# Patient Record
Sex: Female | Born: 1980 | Race: White | Hispanic: No | Marital: Married | Smoking: Never smoker
Health system: Southern US, Community
[De-identification: ages and names within clinical notes are randomized; demographics above are authoritative.]

## PROBLEM LIST (undated history)

## (undated) DIAGNOSIS — Z803 Family history of malignant neoplasm of breast: Secondary | ICD-10-CM

## (undated) DIAGNOSIS — Z8619 Personal history of other infectious and parasitic diseases: Secondary | ICD-10-CM

## (undated) DIAGNOSIS — Z8051 Family history of malignant neoplasm of kidney: Secondary | ICD-10-CM

## (undated) DIAGNOSIS — K219 Gastro-esophageal reflux disease without esophagitis: Secondary | ICD-10-CM

## (undated) HISTORY — DX: Family history of malignant neoplasm of kidney: Z80.51

## (undated) HISTORY — PX: REFRACTIVE SURGERY: SHX103

## (undated) HISTORY — DX: Family history of malignant neoplasm of breast: Z80.3

## (undated) HISTORY — DX: Personal history of other infectious and parasitic diseases: Z86.19

## (undated) HISTORY — PX: WISDOM TOOTH EXTRACTION: SHX21

---

## 2009-10-16 ENCOUNTER — Ambulatory Visit (HOSPITAL_COMMUNITY): Admission: RE | Admit: 2009-10-16 | Discharge: 2009-10-16 | Payer: Self-pay | Admitting: Obstetrics and Gynecology

## 2012-02-19 LAB — OB RESULTS CONSOLE HIV ANTIBODY (ROUTINE TESTING): HIV: NONREACTIVE

## 2012-02-19 LAB — OB RESULTS CONSOLE ABO/RH: RH Type: POSITIVE

## 2012-02-19 LAB — OB RESULTS CONSOLE GC/CHLAMYDIA: Chlamydia: NEGATIVE

## 2012-10-01 ENCOUNTER — Inpatient Hospital Stay (HOSPITAL_COMMUNITY)
Admission: AD | Admit: 2012-10-01 | Payer: BC Managed Care – PPO | Source: Ambulatory Visit | Admitting: Obstetrics and Gynecology

## 2012-10-01 ENCOUNTER — Telehealth (HOSPITAL_COMMUNITY): Payer: Self-pay | Admitting: *Deleted

## 2012-10-01 ENCOUNTER — Encounter (HOSPITAL_COMMUNITY): Payer: Self-pay | Admitting: *Deleted

## 2012-10-01 NOTE — Telephone Encounter (Signed)
Preadmission screen  

## 2012-10-04 ENCOUNTER — Inpatient Hospital Stay (HOSPITAL_COMMUNITY)
Admission: RE | Admit: 2012-10-04 | Discharge: 2012-10-09 | DRG: 370 | Disposition: A | Discharge status: Home / Self Care | Payer: BC Managed Care – PPO | Source: Ambulatory Visit | Attending: Obstetrics & Gynecology | Admitting: Obstetrics & Gynecology

## 2012-10-04 ENCOUNTER — Encounter (HOSPITAL_COMMUNITY): Payer: Self-pay

## 2012-10-04 DIAGNOSIS — D62 Acute posthemorrhagic anemia: Secondary | ICD-10-CM | POA: Diagnosis not present

## 2012-10-04 DIAGNOSIS — O330 Maternal care for disproportion due to deformity of maternal pelvic bones: Principal | ICD-10-CM | POA: Diagnosis present

## 2012-10-04 DIAGNOSIS — O9903 Anemia complicating the puerperium: Secondary | ICD-10-CM | POA: Diagnosis not present

## 2012-10-04 DIAGNOSIS — O339 Maternal care for disproportion, unspecified: Secondary | ICD-10-CM | POA: Diagnosis present

## 2012-10-04 LAB — CBC
MCH: 33.3 pg (ref 26.0–34.0)
MCHC: 35.1 g/dL (ref 30.0–36.0)
Platelets: 242 10*3/uL (ref 150–400)

## 2012-10-04 MED ORDER — FLEET ENEMA 7-19 GM/118ML RE ENEM: 1.0000 | ENEMA | RECTAL | Status: DC | PRN

## 2012-10-04 MED ORDER — ACETAMINOPHEN 325 MG PO TABS: 650.0000 mg | ORAL_TABLET | ORAL | Status: DC | PRN

## 2012-10-04 MED ORDER — LIDOCAINE HCL (PF) 1 % IJ SOLN: 30.0000 mL | INTRAMUSCULAR | Status: DC | PRN

## 2012-10-04 MED ORDER — IBUPROFEN 600 MG PO TABS: 600.0000 mg | ORAL_TABLET | Freq: Four times a day (QID) | ORAL | Status: DC | PRN

## 2012-10-04 MED ORDER — OXYCODONE-ACETAMINOPHEN 5-325 MG PO TABS: 1.0000 | ORAL_TABLET | ORAL | Status: DC | PRN

## 2012-10-04 MED ORDER — CITRIC ACID-SODIUM CITRATE 334-500 MG/5ML PO SOLN
30.0000 mL | ORAL | Status: DC | PRN
  Administered 2012-10-07 (×2): 30 mL via ORAL
  Filled 2012-10-04 (×2): qty 15

## 2012-10-04 MED ORDER — OXYTOCIN 40 UNITS IN LACTATED RINGERS INFUSION - SIMPLE MED
62.5000 mL/h | INTRAVENOUS | Status: DC
  Filled 2012-10-04 (×2): qty 1000

## 2012-10-04 MED ORDER — LACTATED RINGERS IV SOLN
INTRAVENOUS | Status: DC
  Administered 2012-10-04 – 2012-10-07 (×5): via INTRAVENOUS

## 2012-10-04 MED ORDER — ONDANSETRON HCL 4 MG/2ML IJ SOLN
4.0000 mg | Freq: Four times a day (QID) | INTRAMUSCULAR | Status: DC | PRN
  Administered 2012-10-06: 4 mg via INTRAVENOUS
  Filled 2012-10-04: qty 2

## 2012-10-04 MED ORDER — LACTATED RINGERS IV SOLN
500.0000 mL | INTRAVENOUS | Status: DC | PRN
  Administered 2012-10-06: 500 mL via INTRAVENOUS

## 2012-10-04 MED ORDER — TERBUTALINE SULFATE 1 MG/ML IJ SOLN: 0.2500 mg | Freq: Once | INTRAMUSCULAR | Status: AC | PRN

## 2012-10-04 MED ORDER — MISOPROSTOL 25 MCG QUARTER TABLET
25.0000 ug | ORAL_TABLET | ORAL | Status: DC | PRN
  Administered 2012-10-04 – 2012-10-05 (×2): 25 ug via VAGINAL
  Filled 2012-10-04 (×4): qty 0.25

## 2012-10-04 MED ORDER — OXYTOCIN BOLUS FROM INFUSION: 500.0000 mL | INTRAVENOUS | Status: DC

## 2012-10-05 LAB — RPR: RPR Ser Ql: NONREACTIVE

## 2012-10-05 MED ORDER — MISOPROSTOL 25 MCG QUARTER TABLET
25.0000 ug | ORAL_TABLET | ORAL | Status: DC
  Administered 2012-10-05 – 2012-10-06 (×3): 25 ug via VAGINAL
  Filled 2012-10-05: qty 0.25
  Filled 2012-10-05 (×2): qty 1

## 2012-10-05 MED ORDER — TERBUTALINE SULFATE 1 MG/ML IJ SOLN: 0.2500 mg | Freq: Once | INTRAMUSCULAR | Status: AC | PRN

## 2012-10-05 MED ORDER — ZOLPIDEM TARTRATE 5 MG PO TABS
5.0000 mg | ORAL_TABLET | Freq: Every evening | ORAL | Status: DC | PRN
  Administered 2012-10-05: 5 mg via ORAL
  Filled 2012-10-05: qty 1

## 2012-10-05 MED ORDER — OXYTOCIN 40 UNITS IN LACTATED RINGERS INFUSION - SIMPLE MED
1.0000 m[IU]/min | INTRAVENOUS | Status: DC
  Administered 2012-10-05: 1 m[IU]/min via INTRAVENOUS

## 2012-10-05 NOTE — Progress Notes (Signed)
FHT reactive UCs q2-3 min Pitocin on 18 mU Cx no change per nurse check  Will stop pitocin and repeat cytotec this pm

## 2012-10-05 NOTE — Progress Notes (Signed)
FHT reactive UCs q2-4 min, mod Pitocin on

## 2012-10-05 NOTE — H&P (Signed)
Sabrina Reed is a 31 y.o. female presenting for IOL.  S/P cytotec x 2 last pm.  No HA, vision change or epigastric pain. No ROM.                                                                                                                                                                                                                                    Maternal Medical History:  Fetal activity: Perceived fetal activity is normal.      OB History    Grav Para Term Preterm Abortions TAB SAB Ect Mult Living   1 0 0 0 0 0 0 0 0 0      Past Medical History  Diagnosis Date  . Newborn product of IVF pregnancy   . H/O varicella    Past Surgical History  Procedure Date  . Wisdom tooth extraction    Family History: family history includes Autoimmune disease in her cousin, maternal aunt, and mother; Cancer in her father, maternal grandmother, and mother; Mental retardation in her cousin; and Thyroid disease in her mother. Social History:  reports that she has never smoked. She has never used smokeless tobacco. She reports that she does not drink alcohol or use illicit drugs.   Prenatal Transfer Tool  Maternal Diabetes: No Genetic Screening: Normal Maternal Ultrasounds/Referrals: Normal Fetal Ultrasounds or other Referrals:  None Maternal Substance Abuse:  No Significant Maternal Medications:  None Significant Maternal Lab Results:  None Other Comments:  None  Review of Systems  Eyes: Negative for blurred vision.  Gastrointestinal: Negative for abdominal pain.  Neurological: Negative for headaches.    Dilation: 1 Effacement (%): 40 Station: -2 Exam by:: N. Deal, RN Blood pressure 116/70, pulse 62, temperature 98.4 F (36.9 C), temperature source Oral, resp. rate 16, height 5' 2.5" (1.588 m), weight 170 lb (77.111 kg). Maternal Exam:  Uterine Assessment: Contraction strength is mild.  Contraction frequency is irregular.   Abdomen: Fetal presentation: vertex     Fetal  Exam Fetal Monitor Review: Pattern: accelerations present.       Physical Exam  Cardiovascular: Normal rate and regular rhythm.   Respiratory: Effort normal and breath sounds normal.  GI: There is no tenderness.  Neurological: She has normal reflexes.    Prenatal labs: ABO, Rh: A/Positive/-- (05/15 0000) Antibody: Negative (05/15 0000) Rubella: Immune (05/15 0000) RPR: NON REACTIVE (12/29 2000)  HBsAg: Negative (05/15 0000)  HIV:  Non-reactive (05/15 0000)  GBS: Negative (11/22 0000)   Assessment/Plan: 31 yo G1P0 at 40 4/7 weeks for IOL. D/W patient and husband induction and risks including fetal distress, uterine hyperstimulation and cesarean section. All questions answered. Will begin pitocin.   Machi Whittaker II,Sabrina Reed E 10/05/2012, 7:34 AM

## 2012-10-06 ENCOUNTER — Encounter (HOSPITAL_COMMUNITY): Payer: Self-pay | Admitting: Anesthesiology

## 2012-10-06 MED ORDER — TERBUTALINE SULFATE 1 MG/ML IJ SOLN: 0.2500 mg | Freq: Once | INTRAMUSCULAR | Status: AC | PRN

## 2012-10-06 MED ORDER — PHENYLEPHRINE 40 MCG/ML (10ML) SYRINGE FOR IV PUSH (FOR BLOOD PRESSURE SUPPORT): 80.0000 ug | PREFILLED_SYRINGE | INTRAVENOUS | Status: DC | PRN

## 2012-10-06 MED ORDER — OXYTOCIN 40 UNITS IN LACTATED RINGERS INFUSION - SIMPLE MED
1.0000 m[IU]/min | INTRAVENOUS | Status: DC
  Administered 2012-10-07: 21 m[IU]/min via INTRAVENOUS

## 2012-10-06 MED ORDER — FENTANYL 2.5 MCG/ML BUPIVACAINE 1/10 % EPIDURAL INFUSION (WH - ANES)
INTRAMUSCULAR | Status: DC | PRN
  Administered 2012-10-06: 14 mL/h via EPIDURAL

## 2012-10-06 MED ORDER — LACTATED RINGERS IV SOLN
500.0000 mL | Freq: Once | INTRAVENOUS | Status: AC
  Administered 2012-10-06: 500 mL via INTRAVENOUS

## 2012-10-06 MED ORDER — FENTANYL 2.5 MCG/ML BUPIVACAINE 1/10 % EPIDURAL INFUSION (WH - ANES)
14.0000 mL/h | INTRAMUSCULAR | Status: DC
  Administered 2012-10-06 – 2012-10-07 (×2): 14 mL/h via EPIDURAL
  Filled 2012-10-06 (×3): qty 125

## 2012-10-06 MED ORDER — PHENYLEPHRINE 40 MCG/ML (10ML) SYRINGE FOR IV PUSH (FOR BLOOD PRESSURE SUPPORT)
80.0000 ug | PREFILLED_SYRINGE | INTRAVENOUS | Status: DC | PRN
  Filled 2012-10-06: qty 5

## 2012-10-06 MED ORDER — LIDOCAINE HCL (PF) 1 % IJ SOLN
INTRAMUSCULAR | Status: DC | PRN
  Administered 2012-10-06 (×2): 4 mL

## 2012-10-06 MED ORDER — EPHEDRINE 5 MG/ML INJ: 10.0000 mg | INTRAVENOUS | Status: DC | PRN

## 2012-10-06 MED ORDER — EPHEDRINE 5 MG/ML INJ
10.0000 mg | INTRAVENOUS | Status: DC | PRN
  Filled 2012-10-06: qty 4

## 2012-10-06 MED ORDER — DIPHENHYDRAMINE HCL 50 MG/ML IJ SOLN: 12.5000 mg | INTRAMUSCULAR | Status: DC | PRN

## 2012-10-06 NOTE — Progress Notes (Signed)
Dr Marcelle Overlie notified regarding vaginal exam. Cervix 8-9 with edema noted.Caput noted as well. Pt very tearful but stating "Im not ready to throw in the towel". Pt feels warm to touch but temp 98.1. FHR change to 160-170. MD made aware. Also made aware of contraction pattern request for IUPC but states to "keep going". Pt made aware of conversation with Dr Marcelle Overlie. Pt ok with POC at this time.

## 2012-10-06 NOTE — Anesthesia Procedure Notes (Signed)
Epidural Patient location during procedure: OB Start time: 10/06/2012 9:52 AM  Staffing Anesthesiologist: Arren Laminack A. Performed by: anesthesiologist   Preanesthetic Checklist Completed: patient identified, site marked, surgical consent, pre-op evaluation, timeout performed, IV checked, risks and benefits discussed and monitors and equipment checked  Epidural Patient position: sitting Prep: site prepped and draped and DuraPrep Patient monitoring: continuous pulse ox and blood pressure Approach: midline Injection technique: LOR air  Needle:  Needle type: Tuohy  Needle gauge: 17 G Needle length: 9 cm and 9 Needle insertion depth: 4 cm Catheter type: closed end flexible Catheter size: 19 Gauge Catheter at skin depth: 9 cm Test dose: negative and Other  Assessment Events: blood not aspirated, injection not painful, no injection resistance, negative IV test and no paresthesia  Additional Notes Patient identified. Risks and benefits discussed including failed block, incomplete  Pain control, post dural puncture headache, nerve damage, paralysis, blood pressure Changes, nausea, vomiting, reactions to medications-both toxic and allergic and post Partum back pain. All questions were answered. Patient expressed understanding and wished to proceed. Sterile technique was used throughout procedure. Epidural site was Dressed with sterile barrier dressing. No paresthesias, signs of intravascular injection Or signs of intrathecal spread were encountered.  Patient was more comfortable after the epidural was dosed. Please see RN's note for documentation of vital signs and FHR which are stable.

## 2012-10-06 NOTE — Progress Notes (Signed)
6-7/C/0 station, stable FHR

## 2012-10-06 NOTE — Progress Notes (Signed)
Now 1/50/-2>>>AROM>>>ISE>>clear AF

## 2012-10-06 NOTE — Anesthesia Preprocedure Evaluation (Signed)

## 2012-10-07 ENCOUNTER — Inpatient Hospital Stay (HOSPITAL_COMMUNITY): Payer: BC Managed Care – PPO | Admitting: Anesthesiology

## 2012-10-07 ENCOUNTER — Encounter (HOSPITAL_COMMUNITY): Payer: Self-pay | Admitting: Anesthesiology

## 2012-10-07 ENCOUNTER — Encounter (HOSPITAL_COMMUNITY)
Admission: RE | Disposition: A | Discharge status: Home / Self Care | Payer: Self-pay | Source: Ambulatory Visit | Attending: Obstetrics & Gynecology

## 2012-10-07 ENCOUNTER — Encounter (HOSPITAL_COMMUNITY): Payer: Self-pay

## 2012-10-07 SURGERY — Surgical Case
Anesthesia: Epidural | Site: Abdomen | Wound class: Clean Contaminated

## 2012-10-07 MED ORDER — SODIUM CHLORIDE 0.9 % IJ SOLN: 3.0000 mL | Freq: Two times a day (BID) | INTRAMUSCULAR | Status: DC

## 2012-10-07 MED ORDER — LIDOCAINE-EPINEPHRINE (PF) 2 %-1:200000 IJ SOLN
INTRAMUSCULAR | Status: AC
  Filled 2012-10-07: qty 20

## 2012-10-07 MED ORDER — OXYTOCIN 10 UNIT/ML IJ SOLN
INTRAMUSCULAR | Status: AC
  Filled 2012-10-07: qty 4

## 2012-10-07 MED ORDER — SODIUM CHLORIDE 0.9 % IJ SOLN: 3.0000 mL | INTRAMUSCULAR | Status: DC | PRN

## 2012-10-07 MED ORDER — ONDANSETRON HCL 4 MG/2ML IJ SOLN
INTRAMUSCULAR | Status: AC
  Filled 2012-10-07: qty 2

## 2012-10-07 MED ORDER — EPHEDRINE SULFATE 50 MG/ML IJ SOLN
INTRAMUSCULAR | Status: DC | PRN
  Administered 2012-10-07 (×2): 10 mg via INTRAVENOUS
  Administered 2012-10-07: 5 mg via INTRAVENOUS

## 2012-10-07 MED ORDER — BISACODYL 10 MG RE SUPP: 10.0000 mg | Freq: Every day | RECTAL | Status: DC | PRN

## 2012-10-07 MED ORDER — MORPHINE SULFATE (PF) 0.5 MG/ML IJ SOLN
INTRAMUSCULAR | Status: DC | PRN
  Administered 2012-10-07: 4 mg via EPIDURAL

## 2012-10-07 MED ORDER — SCOPOLAMINE 1 MG/3DAYS TD PT72
MEDICATED_PATCH | TRANSDERMAL | Status: AC
  Filled 2012-10-07: qty 1

## 2012-10-07 MED ORDER — NALBUPHINE HCL 10 MG/ML IJ SOLN
5.0000 mg | INTRAMUSCULAR | Status: DC | PRN
  Filled 2012-10-07: qty 1

## 2012-10-07 MED ORDER — NALOXONE HCL 0.4 MG/ML IJ SOLN: 0.4000 mg | INTRAMUSCULAR | Status: DC | PRN

## 2012-10-07 MED ORDER — LACTATED RINGERS IV SOLN
INTRAVENOUS | Status: DC | PRN
  Administered 2012-10-06 – 2012-10-07 (×2): via INTRAVENOUS

## 2012-10-07 MED ORDER — EPHEDRINE 5 MG/ML INJ
INTRAVENOUS | Status: AC
  Filled 2012-10-07: qty 10

## 2012-10-07 MED ORDER — MEPERIDINE HCL 25 MG/ML IJ SOLN
INTRAMUSCULAR | Status: AC
  Filled 2012-10-07: qty 1

## 2012-10-07 MED ORDER — OXYTOCIN 40 UNITS IN LACTATED RINGERS INFUSION - SIMPLE MED: 62.5000 mL/h | INTRAVENOUS | Status: AC

## 2012-10-07 MED ORDER — MORPHINE SULFATE (PF) 0.5 MG/ML IJ SOLN
INTRAMUSCULAR | Status: DC | PRN
  Administered 2012-10-07: 1 mg via EPIDURAL

## 2012-10-07 MED ORDER — OXYCODONE-ACETAMINOPHEN 5-325 MG PO TABS
1.0000 | ORAL_TABLET | Freq: Four times a day (QID) | ORAL | Status: DC | PRN
  Administered 2012-10-07 – 2012-10-08 (×2): 1 via ORAL
  Filled 2012-10-07 (×2): qty 1

## 2012-10-07 MED ORDER — FENTANYL CITRATE 0.05 MG/ML IJ SOLN
INTRAMUSCULAR | Status: AC
  Filled 2012-10-07: qty 2

## 2012-10-07 MED ORDER — MIDAZOLAM HCL 2 MG/2ML IJ SOLN: 0.5000 mg | Freq: Once | INTRAMUSCULAR | Status: DC | PRN

## 2012-10-07 MED ORDER — LANOLIN HYDROUS EX OINT: 1.0000 "application " | TOPICAL_OINTMENT | CUTANEOUS | Status: DC | PRN

## 2012-10-07 MED ORDER — DIPHENHYDRAMINE HCL 25 MG PO CAPS: 25.0000 mg | ORAL_CAPSULE | Freq: Four times a day (QID) | ORAL | Status: DC | PRN

## 2012-10-07 MED ORDER — SENNOSIDES-DOCUSATE SODIUM 8.6-50 MG PO TABS
2.0000 | ORAL_TABLET | Freq: Every day | ORAL | Status: DC
  Administered 2012-10-07 – 2012-10-08 (×2): 2 via ORAL

## 2012-10-07 MED ORDER — DIPHENHYDRAMINE HCL 50 MG/ML IJ SOLN: 25.0000 mg | INTRAMUSCULAR | Status: DC | PRN

## 2012-10-07 MED ORDER — SIMETHICONE 80 MG PO CHEW
80.0000 mg | CHEWABLE_TABLET | Freq: Three times a day (TID) | ORAL | Status: DC
  Administered 2012-10-07 – 2012-10-09 (×7): 80 mg via ORAL

## 2012-10-07 MED ORDER — DIPHENHYDRAMINE HCL 50 MG/ML IJ SOLN: 12.5000 mg | INTRAMUSCULAR | Status: DC | PRN

## 2012-10-07 MED ORDER — ONDANSETRON HCL 4 MG/2ML IJ SOLN: 4.0000 mg | Freq: Three times a day (TID) | INTRAMUSCULAR | Status: DC | PRN

## 2012-10-07 MED ORDER — WITCH HAZEL-GLYCERIN EX PADS: 1.0000 "application " | MEDICATED_PAD | CUTANEOUS | Status: DC | PRN

## 2012-10-07 MED ORDER — MENTHOL 3 MG MT LOZG: 1.0000 | LOZENGE | OROMUCOSAL | Status: DC | PRN

## 2012-10-07 MED ORDER — NALOXONE HCL 1 MG/ML IJ SOLN
1.0000 ug/kg/h | INTRAVENOUS | Status: DC | PRN
  Filled 2012-10-07: qty 2

## 2012-10-07 MED ORDER — PROMETHAZINE HCL 25 MG/ML IJ SOLN: 6.2500 mg | INTRAMUSCULAR | Status: DC | PRN

## 2012-10-07 MED ORDER — FENTANYL CITRATE 0.05 MG/ML IJ SOLN
25.0000 ug | INTRAMUSCULAR | Status: DC | PRN
  Administered 2012-10-07: 50 ug via INTRAVENOUS

## 2012-10-07 MED ORDER — KETOROLAC TROMETHAMINE 30 MG/ML IJ SOLN
INTRAMUSCULAR | Status: AC
  Filled 2012-10-07: qty 1

## 2012-10-07 MED ORDER — IBUPROFEN 800 MG PO TABS
800.0000 mg | ORAL_TABLET | Freq: Three times a day (TID) | ORAL | Status: DC | PRN
  Administered 2012-10-07 – 2012-10-09 (×6): 800 mg via ORAL
  Filled 2012-10-07 (×6): qty 1

## 2012-10-07 MED ORDER — ONDANSETRON HCL 4 MG/2ML IJ SOLN
INTRAMUSCULAR | Status: DC | PRN
  Administered 2012-10-07: 4 mg via INTRAVENOUS

## 2012-10-07 MED ORDER — MORPHINE SULFATE 0.5 MG/ML IJ SOLN
INTRAMUSCULAR | Status: AC
  Filled 2012-10-07: qty 10

## 2012-10-07 MED ORDER — SIMETHICONE 80 MG PO CHEW: 80.0000 mg | CHEWABLE_TABLET | ORAL | Status: DC | PRN

## 2012-10-07 MED ORDER — SODIUM CHLORIDE 0.9 % IV SOLN: 250.0000 mL | INTRAVENOUS | Status: DC

## 2012-10-07 MED ORDER — SCOPOLAMINE 1 MG/3DAYS TD PT72
1.0000 | MEDICATED_PATCH | Freq: Once | TRANSDERMAL | Status: DC
  Administered 2012-10-07: 1.5 mg via TRANSDERMAL

## 2012-10-07 MED ORDER — SODIUM BICARBONATE 8.4 % IV SOLN
INTRAVENOUS | Status: AC
  Filled 2012-10-07: qty 50

## 2012-10-07 MED ORDER — DIBUCAINE 1 % RE OINT: 1.0000 "application " | TOPICAL_OINTMENT | RECTAL | Status: DC | PRN

## 2012-10-07 MED ORDER — OXYTOCIN 10 UNIT/ML IJ SOLN
40.0000 [IU] | INTRAVENOUS | Status: DC | PRN
  Administered 2012-10-07: 40 [IU] via INTRAVENOUS

## 2012-10-07 MED ORDER — FLEET ENEMA 7-19 GM/118ML RE ENEM: 1.0000 | ENEMA | Freq: Every day | RECTAL | Status: DC | PRN

## 2012-10-07 MED ORDER — KETOROLAC TROMETHAMINE 30 MG/ML IJ SOLN
30.0000 mg | Freq: Four times a day (QID) | INTRAMUSCULAR | Status: AC | PRN
  Administered 2012-10-07: 30 mg via INTRAVENOUS

## 2012-10-07 MED ORDER — METOCLOPRAMIDE HCL 5 MG/ML IJ SOLN: 10.0000 mg | Freq: Three times a day (TID) | INTRAMUSCULAR | Status: DC | PRN

## 2012-10-07 MED ORDER — PRENATAL MULTIVITAMIN CH
1.0000 | ORAL_TABLET | Freq: Every day | ORAL | Status: DC
  Administered 2012-10-08 – 2012-10-09 (×2): 1 via ORAL
  Filled 2012-10-07 (×2): qty 1

## 2012-10-07 MED ORDER — MEPERIDINE HCL 25 MG/ML IJ SOLN: 6.2500 mg | INTRAMUSCULAR | Status: DC | PRN

## 2012-10-07 MED ORDER — KETOROLAC TROMETHAMINE 30 MG/ML IJ SOLN: 30.0000 mg | Freq: Four times a day (QID) | INTRAMUSCULAR | Status: AC | PRN

## 2012-10-07 MED ORDER — MEASLES, MUMPS & RUBELLA VAC ~~LOC~~ INJ
0.5000 mL | INJECTION | Freq: Once | SUBCUTANEOUS | Status: DC
  Filled 2012-10-07: qty 0.5

## 2012-10-07 MED ORDER — ONDANSETRON HCL 4 MG PO TABS: 4.0000 mg | ORAL_TABLET | ORAL | Status: DC | PRN

## 2012-10-07 MED ORDER — TETANUS-DIPHTH-ACELL PERTUSSIS 5-2.5-18.5 LF-MCG/0.5 IM SUSP: 0.5000 mL | Freq: Once | INTRAMUSCULAR | Status: DC

## 2012-10-07 MED ORDER — CEFAZOLIN SODIUM-DEXTROSE 2-3 GM-% IV SOLR
2.0000 g | Freq: Three times a day (TID) | INTRAVENOUS | Status: DC
  Administered 2012-10-07: 2 g via INTRAVENOUS
  Filled 2012-10-07 (×2): qty 50

## 2012-10-07 MED ORDER — LACTATED RINGERS IV SOLN
INTRAVENOUS | Status: DC | PRN
  Administered 2012-10-07: 03:00:00 via INTRAVENOUS

## 2012-10-07 MED ORDER — ONDANSETRON HCL 4 MG/2ML IJ SOLN: 4.0000 mg | INTRAMUSCULAR | Status: DC | PRN

## 2012-10-07 MED ORDER — ZOLPIDEM TARTRATE 5 MG PO TABS: 5.0000 mg | ORAL_TABLET | Freq: Every evening | ORAL | Status: DC | PRN

## 2012-10-07 MED ORDER — DIPHENHYDRAMINE HCL 25 MG PO CAPS: 25.0000 mg | ORAL_CAPSULE | ORAL | Status: DC | PRN

## 2012-10-07 MED ORDER — MEPERIDINE HCL 25 MG/ML IJ SOLN
INTRAMUSCULAR | Status: DC | PRN
  Administered 2012-10-07 (×2): 12.5 mg via INTRAVENOUS

## 2012-10-07 SURGICAL SUPPLY — 27 items
CLOTH BEACON ORANGE TIMEOUT ST (SAFETY) ×2 IMPLANT
DRAPE LG THREE QUARTER DISP (DRAPES) ×2 IMPLANT
DRESSING TELFA 8X3 (GAUZE/BANDAGES/DRESSINGS) IMPLANT
DRSG OPSITE POSTOP 4X10 (GAUZE/BANDAGES/DRESSINGS) IMPLANT
DURAPREP 26ML APPLICATOR (WOUND CARE) ×2 IMPLANT
ELECT REM PT RETURN 9FT ADLT (ELECTROSURGICAL) ×2
ELECTRODE REM PT RTRN 9FT ADLT (ELECTROSURGICAL) ×1 IMPLANT
EXTRACTOR VACUUM M CUP 4 TUBE (SUCTIONS) IMPLANT
GAUZE SPONGE 4X4 12PLY STRL LF (GAUZE/BANDAGES/DRESSINGS) ×4 IMPLANT
GLOVE BIO SURGEON STRL SZ7 (GLOVE) ×4 IMPLANT
GOWN PREVENTION PLUS LG XLONG (DISPOSABLE) ×6 IMPLANT
KIT ABG SYR 3ML LUER SLIP (SYRINGE) IMPLANT
NEEDLE HYPO 25X5/8 SAFETYGLIDE (NEEDLE) ×2 IMPLANT
NS IRRIG 1000ML POUR BTL (IV SOLUTION) ×2 IMPLANT
PACK C SECTION WH (CUSTOM PROCEDURE TRAY) ×2 IMPLANT
PAD ABD 7.5X8 STRL (GAUZE/BANDAGES/DRESSINGS) IMPLANT
PAD OB MATERNITY 4.3X12.25 (PERSONAL CARE ITEMS) IMPLANT
SLEEVE SCD COMPRESS KNEE MED (MISCELLANEOUS) IMPLANT
STRIP CLOSURE SKIN 1/2X4 (GAUZE/BANDAGES/DRESSINGS) ×2 IMPLANT
SUT CHROMIC 0 CTX 36 (SUTURE) ×6 IMPLANT
SUT MON AB 4-0 PS1 27 (SUTURE) ×2 IMPLANT
SUT PDS AB 0 CT1 27 (SUTURE) ×4 IMPLANT
SUT VIC AB 3-0 CT1 27 (SUTURE) ×2
SUT VIC AB 3-0 CT1 TAPERPNT 27 (SUTURE) ×2 IMPLANT
TOWEL OR 17X24 6PK STRL BLUE (TOWEL DISPOSABLE) ×6 IMPLANT
TRAY FOLEY CATH 14FR (SET/KITS/TRAYS/PACK) ×2 IMPLANT
WATER STERILE IRR 1000ML POUR (IV SOLUTION) ×2 IMPLANT

## 2012-10-07 NOTE — Anesthesia Postprocedure Evaluation (Signed)
Anesthesia Post Note  Patient: Sabrina Reed  Procedure(s) Performed: Procedure(s) (LRB): CESAREAN SECTION (N/A)  Anesthesia type: Epidural  Patient location: PACU  Post pain: Pain level controlled  Post assessment: Post-op Vital signs reviewed  Last Vitals:  Filed Vitals:   10/07/12 0231  BP: 122/72  Pulse: 63  Temp:   Resp: 18    Post vital signs: Reviewed  Level of consciousness: awake  Complications: No apparent anesthesia complications

## 2012-10-07 NOTE — Progress Notes (Signed)
Still 9/C/0, stable FHR, pt comfortable...will recheck 0200, discussed poss CS for CPD

## 2012-10-07 NOTE — Op Note (Signed)
Preoperative diagnosis: CPD  Postoperative diagnosis: Same plus OP presentation  Her seizure: Primary low transverse cesarean section  Surgeon: Marcelle Overlie  Anesthesia: Epidural  EBL: 700 cc  Specimens removed: Placenta, to pathology  Complications: None  Procedure and findings:  The patient taken the operating room after an adequate level of epidural anesthesia was obtained appropriate timeout for taken. She had received Ancef 2 g IV preoperatively. Transverse incision made 2 finger breaths above the symphysis carried down to the fascia which was incised and extended transversely. Rectus muscle divided in the midline, peritoneum entered superiorly without incident and extended in a vertical fashion. The vesicouterine serosa was incised and the bladder was sharply and bluntly dissected below, bladder blade repositioned. Transverse incision made lower segment extended with blunt dissection moderate meconium was noted the infant was noted to be straight OP, the infant was delivered, suctioned and passed the pediatric pediatric team for further care. Cord pH was obtained the placenta was then delivered manually intact, sent to pathology uterus exteriorized cavity wiped clean with a laparotomy pack closure transversely of 0 chromic in a locked fashion followed by #layer of 0 chromic. This is hemostatic the bladder flap area was intact and hemostatic. Bilateral tubes and ovaries were normal prior to closure sponge denies precast reported as correct x2 peritoneum closed with a 2-0 Vicryl suture. 2-0 Vicryl interrupted sutures were then used to reapproximate the rectus muscles in the midline a 0 PDS was then used to close the fascia from laterally to midline on either side. Subcutaneous tissue was minimal to moderate and hemostatic was not closed separately a 4-0 Monocryl subcuticular closure with a sterile dressing applied she tolerated this well went to recovery room in good condition  Dictated with dragon  medical  Sabrina Reed M. [place]

## 2012-10-07 NOTE — Transfer of Care (Signed)
Immediate Anesthesia Transfer of Care Note  Patient: Sabrina Reed  Procedure(s) Performed: Procedure(s) (LRB) with comments: CESAREAN SECTION (N/A)  Patient Location: PACU  Anesthesia Type:Epidural  Level of Consciousness: awake, alert  and oriented  Airway & Oxygen Therapy: Patient Spontanous Breathing  Post-op Assessment: Report given to PACU RN  Post vital signs: Reviewed and stable  Complications: No apparent anesthesia complications

## 2012-10-07 NOTE — Progress Notes (Signed)
Subjective: Postpartum Day 0: Cesarean Delivery Patient reports incisional pain.    Objective: Vital signs in last 24 hours: Temp:  [97.9 F (36.6 C)-99.5 F (37.5 C)] 98.1 F (36.7 C) (01/01 0718) Pulse Rate:  [60-122] 92  (01/01 0718) Resp:  [15-27] 18  (01/01 0718) BP: (86-139)/(53-88) 98/61 mmHg (01/01 0718) SpO2:  [92 %-99 %] 97 % (01/01 0718)  Physical Exam:  General: alert Lochia: appropriate Uterine Fundus: firm Incision: healing well DVT Evaluation: No evidence of DVT seen on physical exam.   Basename 10/04/12 2000  HGB 12.5  HCT 35.6*    Assessment/Plan: Status post Cesarean section. Doing well postoperatively.  Continue current care.  Tannah Dreyfuss M 10/07/2012, 11:51 AM

## 2012-10-07 NOTE — Progress Notes (Signed)
Still 9cm w/ incr caput, stable FHR>>>Imp CPD, Rec CS, proced discussed

## 2012-10-07 NOTE — Anesthesia Postprocedure Evaluation (Signed)
Anesthesia Post Note  Patient: Sabrina Reed  Procedure(s) Performed: Procedure(s) (LRB): CESAREAN SECTION (N/A)  Anesthesia type: Epidural  Patient location: Mother/Baby  Post pain: Pain level controlled  Post assessment: Post-op Vital signs reviewed  Last Vitals:  Filed Vitals:   10/07/12 0718  BP: 98/61  Pulse: 92  Temp: 36.7 C  Resp: 18    Post vital signs: Reviewed  Level of consciousness:alert  Complications: No apparent anesthesia complications

## 2012-10-08 LAB — CBC
HCT: 21.4 % — ABNORMAL LOW (ref 36.0–46.0)
Hemoglobin: 7.2 g/dL — ABNORMAL LOW (ref 12.0–15.0)
MCV: 96.8 fL (ref 78.0–100.0)
RBC: 2.21 MIL/uL — ABNORMAL LOW (ref 3.87–5.11)
WBC: 20.4 10*3/uL — ABNORMAL HIGH (ref 4.0–10.5)

## 2012-10-08 MED ORDER — FERROUS SULFATE 325 (65 FE) MG PO TABS
325.0000 mg | ORAL_TABLET | Freq: Two times a day (BID) | ORAL | Status: DC
  Administered 2012-10-08 – 2012-10-09 (×2): 325 mg via ORAL
  Filled 2012-10-08 (×2): qty 1

## 2012-10-08 NOTE — Progress Notes (Signed)
Subjective: Postpartum Day 1: Cesarean Delivery Patient reports tolerating PO and no problems voiding.  Denies dizziness on standing, CP/SOB however has ambulated minimally.   Objective: Vital signs in last 24 hours: Temp:  [97.4 F (36.3 C)-98.4 F (36.9 C)] 98.2 F (36.8 C) (01/02 0617) Pulse Rate:  [70-123] 74  (01/02 0617) Resp:  [16-18] 16  (01/02 0617) BP: (90-116)/(53-73) 90/57 mmHg (01/02 0617) SpO2:  [96 %-98 %] 96 % (01/02 0617)  Physical Exam:  General: alert, cooperative and appears stated age 32: appropriate Uterine Fundus: firm Incision: healing well, no significant drainage, no dehiscence, no significant erythema DVT Evaluation: No evidence of DVT seen on physical exam. Negative Homan's sign. No cords or calf tenderness.   Basename 10/08/12 0520  HGB 7.2*  HCT 21.4*    Assessment/Plan: Status post Cesarean section. Doing well postoperatively.  ABL anemia-offered blood transfusion but declines at this time secondary to asx.  Will tx prn.  FeSO4.   Continue current care.  Sabrina Reed 10/08/2012, 7:51 AM

## 2012-10-08 NOTE — Progress Notes (Signed)
UR chart review completed.  

## 2012-10-09 LAB — CBC
HCT: 23.6 % — ABNORMAL LOW (ref 36.0–46.0)
Hemoglobin: 7.9 g/dL — ABNORMAL LOW (ref 12.0–15.0)
MCH: 33.3 pg (ref 26.0–34.0)
MCHC: 33.5 g/dL (ref 30.0–36.0)

## 2012-10-09 MED ORDER — OXYCODONE-ACETAMINOPHEN 7.5-500 MG PO TABS: 1.0000 | ORAL_TABLET | ORAL | Status: DC | PRN

## 2012-10-09 NOTE — Discharge Summary (Signed)
Obstetric Discharge Summary Reason for Admission: induction of labor Prenatal Procedures: ultrasound Intrapartum Procedures: cesarean: low cervical, transverse Postpartum Procedures: none Complications-Operative and Postpartum: anemia Hemoglobin  Date Value Range Status  10/09/2012 7.9* 12.0 - 15.0 g/dL Final     HCT  Date Value Range Status  10/09/2012 23.6* 36.0 - 46.0 % Final    Physical Exam:  General: alert Lochia: appropriate Uterine Fundus: firm Incision: healing well DVT Evaluation: No evidence of DVT seen on physical exam.  Discharge Diagnoses: Term Pregnancy-delivered  Discharge Information: Date: 10/09/2012 Activity: pelvic rest Diet: routine Medications: None Condition: stable Instructions: refer to practice specific booklet Discharge to: home   Newborn Data: Live born female  Birth Weight: 9 lb 9.4 oz (4349 g) APGAR: 9, 9  Home with mother.  Audreanna Torrisi S 10/09/2012, 1:33 PM

## 2012-10-09 NOTE — Progress Notes (Signed)
S: No complaints, Denies dizziness with ambulation. Voiding well and + BM O: VSS, afebrile Abd soft FF U even Honeycomb dressing noted without evidence of drainage No evidence of DVT  No significant ankle edema  A: S/p Cesarean delivery post op day #2 anema P: discussed late pm discharge  RTO ~1 week for  Incision check

## 2013-11-20 ENCOUNTER — Encounter (HOSPITAL_COMMUNITY): Payer: Self-pay | Admitting: Emergency Medicine

## 2013-11-20 ENCOUNTER — Emergency Department (HOSPITAL_COMMUNITY): Payer: BC Managed Care – PPO

## 2013-11-20 ENCOUNTER — Emergency Department (HOSPITAL_COMMUNITY)
Admission: EM | Admit: 2013-11-20 | Discharge: 2013-11-20 | Disposition: A | Discharge status: Home / Self Care | Payer: BC Managed Care – PPO | Attending: Emergency Medicine | Admitting: Emergency Medicine

## 2013-11-20 DIAGNOSIS — Y929 Unspecified place or not applicable: Secondary | ICD-10-CM | POA: Insufficient documentation

## 2013-11-20 DIAGNOSIS — Y9389 Activity, other specified: Secondary | ICD-10-CM | POA: Insufficient documentation

## 2013-11-20 DIAGNOSIS — S61209A Unspecified open wound of unspecified finger without damage to nail, initial encounter: Secondary | ICD-10-CM | POA: Insufficient documentation

## 2013-11-20 DIAGNOSIS — Z8619 Personal history of other infectious and parasitic diseases: Secondary | ICD-10-CM | POA: Insufficient documentation

## 2013-11-20 DIAGNOSIS — W278XXA Contact with other nonpowered hand tool, initial encounter: Secondary | ICD-10-CM | POA: Insufficient documentation

## 2013-11-20 DIAGNOSIS — S61019A Laceration without foreign body of unspecified thumb without damage to nail, initial encounter: Secondary | ICD-10-CM

## 2013-11-20 MED ORDER — OXYCODONE-ACETAMINOPHEN 5-325 MG PO TABS: 0.5000 | ORAL_TABLET | Freq: Three times a day (TID) | ORAL | Status: DC | PRN

## 2013-11-20 NOTE — ED Provider Notes (Signed)
CSN: [id number]     Arrival date & time 11/20/13  1236 History  This chart was scribed for non-physician practitioner, Raymon Mutton, PA-C working with Gavin Pound. Oletta Lamas, MD by Greggory Stallion, ED scribe. This patient was seen in room WTR5/WTR5 and the patient's care was started at 1:09 PM   Chief Complaint  Patient presents with  . Laceration   The history is provided by the patient. No language interpreter was used.   HPI Comments: Sabrina Reed is a 33 y.o. female who presents to the Emergency Department complaining of a right thumb laceration that occurred about one hour ago. She states she accidentally cut her finger with a mandolin. Pt states she has some numbness but denies any pain. The bleeding is now under control. Denies syncope, light headedness. Pt is UTD on her tetanus vaccination.   Past Medical History  Diagnosis Date  . Newborn product of IVF pregnancy   . H/O varicella    Past Surgical History  Procedure Laterality Date  . Wisdom tooth extraction    . Cesarean section  10/07/2012    Procedure: CESAREAN SECTION;  Surgeon: Meriel Pica, MD;  Location: [facility];  Service: Obstetrics;  Laterality: N/A;   Family History  Problem Relation Age of Onset  . Thyroid disease Mother   . Autoimmune disease Mother   . Cancer Mother     kidney  . Mental retardation Cousin   . Autoimmune disease Cousin   . Cancer Father     prostate  . Autoimmune disease Maternal Aunt   . Cancer Maternal Grandmother     breast   History  Substance Use Topics  . Smoking status: Never Smoker   . Smokeless tobacco: Never Used  . Alcohol Use: No   OB History   Grav Para Term Preterm Abortions TAB SAB Ect Mult Living   1 1 1  0 0 0 0 0 0 1     Review of Systems  Musculoskeletal: Negative for arthralgias.  Skin: Positive for wound.  Neurological: Positive for numbness. Negative for syncope and light-headedness.  All other systems reviewed and are negative.   Allergies  Review of  patient's allergies indicates no known allergies.  Home Medications   Current Outpatient Rx  Name  Route  Sig  Dispense  Refill  . oxyCODONE-acetaminophen (PERCOCET/ROXICET) 5-325 MG per tablet   Oral   Take 0.5 tablets by mouth every 8 (eight) hours as needed for severe pain.   3 tablet   0    BP 115/71  Pulse 72  Temp(Src) 98.4 F (36.9 C) (Oral)  Resp 18  SpO2 97%  LMP 10/21/2013  Physical Exam  Nursing note and vitals reviewed. Constitutional: She is oriented to person, place, and time. She appears well-developed and well-nourished. No distress.  HENT:  Head: Normocephalic and atraumatic.  Mouth/Throat: Oropharynx is clear and moist. No oropharyngeal exudate.  Eyes: Conjunctivae and EOM are normal. Pupils are equal, round, and reactive to light. Right eye exhibits no discharge. Left eye exhibits no discharge.  Neck: Normal range of motion. Neck supple.  Cardiovascular: Normal rate, regular rhythm and normal heart sounds.   No murmur heard. Pulses:      Radial pulses are 2+ on the right side, and 2+ on the left side.  Cap refill less than 3 seconds  Pulmonary/Chest: Effort normal and breath sounds normal. No respiratory distress. She has no wheezes. She has no rales.  Musculoskeletal: Normal range of motion.  Full range  of motion to the right thumb-full flexion, extension, adduction and abduction without difficulty.  Neurological: She is alert and oriented to person, place, and time. She exhibits normal muscle tone. Coordination normal.  Strength intact to MCP, PIP, DIP joints of the right hand. Sensation intact with differentiation to sharp and dull touch  Skin: Skin is warm and dry.  Clean laceration of the ulnar aspect of the right thumb. Bleeding mildly controlled. No flap noted. Patient brought in skin that came off-dried and necrotic.   Psychiatric: She has a normal mood and affect. Her behavior is normal. Thought content normal.    ED Course  Procedures  (including critical care time)  DIAGNOSTIC STUDIES: Oxygen Saturation is 97% on RA, normal by my interpretation.    COORDINATION OF CARE: 1:14 PM-Discussed treatment plan which includes laceration repair with pt at bedside and pt agreed to plan.   Dg Finger Thumb Right  11/20/2013   CLINICAL DATA:  Laceration  EXAM: RIGHT THUMB 2+V  COMPARISON:  None.  FINDINGS: There is an apparent lucent soft tissue defect at the base of the left thumb which could correspond to the reported laceration, although soft tissue fold could appear similar. No radiopaque foreign body. No fracture or dislocation.  IMPRESSION: Lucent soft tissue defect of the base of the thumb, correlate clinically whether this represents the site of the clinically reported laceration. No fracture identified.   Electronically Signed   By: Christiana PellantGretchen  Green M.D.   On: 11/20/2013 14:08   Labs Review Labs Reviewed - No data to display Imaging Review Dg Finger Thumb Right  11/20/2013   CLINICAL DATA:  Laceration  EXAM: RIGHT THUMB 2+V  COMPARISON:  None.  FINDINGS: There is an apparent lucent soft tissue defect at the base of the left thumb which could correspond to the reported laceration, although soft tissue fold could appear similar. No radiopaque foreign body. No fracture or dislocation.  IMPRESSION: Lucent soft tissue defect of the base of the thumb, correlate clinically whether this represents the site of the clinically reported laceration. No fracture identified.   Electronically Signed   By: Christiana PellantGretchen  Green M.D.   On: 11/20/2013 14:08    EKG Interpretation   None       MDM   Final diagnoses:  Thumb laceration   Filed Vitals:   11/20/13 1238  BP: 115/71  Pulse: 72  Temp: 98.4 F (36.9 C)  TempSrc: Oral  Resp: 18  SpO2: 97%   I personally performed the services described in this documentation, which was scribed in my presence. The recorded information has been reviewed and is accurate.  Patient presenting to  emergency department with laceration to the right thumb after cutting potatoes using a mandolin. Reported that she is up to date with her tetanus shot. Alert and oriented. GCS 15. Heart rate and rhythm normal. Lungs clear to auscultation. Radial pulses 2+. Cap refill less than 3 seconds. Clean aspiration to the ulnar aspect of the right thumb with no flap noted-bleeding controlled. Patient has brought in her skin that came off from the mandolin-necrotic and dried. Discussed with attending physician case and showed the attending remaining skin at the patient brought with her. As per attending, reported that this cannot be attached. Recommended wounds seal to be placed in Xeroform. Plain film negative radiopaque or fracture noted-negative open fractures. Bleeding controlled while in ED setting. Wound seal applied. Discussed with patient proper wound care. Discharge patient with Xeroform and discussed wound hygiene and wound care.  Referred patient to primary care provider and Dr. Mina Marble - hand specialist. Discharged patient with small dose of pain medications discussed course, precautions, disposal technique. Discussed with patient to rest and elevate. Discussed with patient to closely monitor symptoms and if symptoms are to worsen or change to report back to the ED - strict return instructions given.  Patient agreed to plan of care, understood, all questions answered.   [employer], PA-C 11/21/13 [phone]

## 2013-11-20 NOTE — ED Notes (Signed)
Pt reports cutting the end of her thumb with a knife about one hour ago. Pt is A/O x4, in NAD, and vitals are WDL. Bleeding is controlled with guaze dressing.

## 2013-11-20 NOTE — Discharge Instructions (Signed)
Please call your doctor for a followup appointment within 24-48 hours. When you talk to your doctor please let them know that you were seen in the emergency department and have them acquire all of your records so that they can discuss the findings with you and formulate a treatment plan to fully care for your new and ongoing problems. Please call and set-up an appointment with your primary care provider to be reassessed Please call and set-up an appointment with Dr. Mina MarbleWeingold, hand specialist regarding wound and healing of the laceration Please wash wound with warm water and soap, gently massage, gently wash away with water and pat dry Please apply bacitracin and Xeroform to the skin and wrap with fresh guaze Please if the bandage gets wet please change Please take medications as prescribed while on pain medications there is to be no drinking alcohol, driving, operating any heavy machinery - if there is extra please dispose in a proper manner. Please do not take any extra Tylenol with this medication for this can lead to Tylenol overdose and liver issues. Please continue to monitor symptoms closely and if symptoms are to worsen or change (fever greater than 101, chills, swelling to the hand, red streaks, pus drainage, black tissue, green tissue, bleeding within the next week, numbness to the finger, reinjury) please report back to emergency department immediately  Laceration Care, Adult A laceration is a cut or lesion that goes through all layers of the skin and into the tissue just beneath the skin. TREATMENT  Some lacerations may not require closure. Some lacerations may not be able to be closed due to an increased risk of infection. It is important to see your caregiver as soon as possible after an injury to minimize the risk of infection and maximize the opportunity for successful closure. If closure is appropriate, pain medicines may be given, if needed. The wound will be cleaned to help prevent  infection. Your caregiver will use stitches (sutures), staples, wound glue (adhesive), or skin adhesive strips to repair the laceration. These tools bring the skin edges together to allow for faster healing and a better cosmetic outcome. However, all wounds will heal with a scar. Once the wound has healed, scarring can be minimized by covering the wound with sunscreen during the day for 1 full year. HOME CARE INSTRUCTIONS  For sutures or staples:  Keep the wound clean and dry.  If you were given a bandage (dressing), you should change it at least once a day. Also, change the dressing if it becomes wet or dirty, or as directed by your caregiver.  Wash the wound with soap and water 2 times a day. Rinse the wound off with water to remove all soap. Pat the wound dry with a clean towel.  After cleaning, apply a thin layer of the antibiotic ointment as recommended by your caregiver. This will help prevent infection and keep the dressing from sticking.  You may shower as usual after the first 24 hours. Do not soak the wound in water until the sutures are removed.  Only take over-the-counter or prescription medicines for pain, discomfort, or fever as directed by your caregiver.  Get your sutures or staples removed as directed by your caregiver. For skin adhesive strips:  Keep the wound clean and dry.  Do not get the skin adhesive strips wet. You may bathe carefully, using caution to keep the wound dry.  If the wound gets wet, pat it dry with a clean towel.  Skin adhesive strips  will fall off on their own. You may trim the strips as the wound heals. Do not remove skin adhesive strips that are still stuck to the wound. They will fall off in time. For wound adhesive:  You may briefly wet your wound in the shower or bath. Do not soak or scrub the wound. Do not swim. Avoid periods of heavy perspiration until the skin adhesive has fallen off on its own. After showering or bathing, gently pat the wound  dry with a clean towel.  Do not apply liquid medicine, cream medicine, or ointment medicine to your wound while the skin adhesive is in place. This may loosen the film before your wound is healed.  If a dressing is placed over the wound, be careful not to apply tape directly over the skin adhesive. This may cause the adhesive to be pulled off before the wound is healed.  Avoid prolonged exposure to sunlight or tanning lamps while the skin adhesive is in place. Exposure to ultraviolet light in the first year will darken the scar.  The skin adhesive will usually remain in place for 5 to 10 days, then naturally fall off the skin. Do not pick at the adhesive film. You may need a tetanus shot if:  You cannot remember when you had your last tetanus shot.  You have never had a tetanus shot. If you get a tetanus shot, your arm may swell, get red, and feel warm to the touch. This is common and not a problem. If you need a tetanus shot and you choose not to have one, there is a rare chance of getting tetanus. Sickness from tetanus can be serious. SEEK MEDICAL CARE IF:   You have redness, swelling, or increasing pain in the wound.  You see a red line that goes away from the wound.  You have yellowish-white fluid (pus) coming from the wound.  You have a fever.  You notice a bad smell coming from the wound or dressing.  Your wound breaks open before or after sutures have been removed.  You notice something coming out of the wound such as wood or glass.  Your wound is on your hand or foot and you cannot move a finger or toe. SEEK IMMEDIATE MEDICAL CARE IF:   Your pain is not controlled with prescribed medicine.  You have severe swelling around the wound causing pain and numbness or a change in color in your arm, hand, leg, or foot.  Your wound splits open and starts bleeding.  You have worsening numbness, weakness, or loss of function of any joint around or beyond the wound.  You develop  painful lumps near the wound or on the skin anywhere on your body. MAKE SURE YOU:   Understand these instructions.  Will watch your condition.  Will get help right away if you are not doing well or get worse. Document Released: 09/23/2005 Document Revised: 12/16/2011 Document Reviewed: 03/19/2011 [facility] Patient Information 2014 [place], [place].

## 2013-11-22 NOTE — ED Provider Notes (Signed)
Medical screening examination/treatment/procedure(s) were performed by non-physician practitioner and as supervising physician I was immediately available for consultation/collaboration.  EKG Interpretation   None         Gavin PoundMichael Y. Oletta LamasGhim, MD 11/22/13 [id number]

## 2014-07-25 LAB — OB RESULTS CONSOLE HEPATITIS B SURFACE ANTIGEN: HEP B S AG: NEGATIVE

## 2014-07-25 LAB — OB RESULTS CONSOLE ABO/RH: RH Type: POSITIVE

## 2014-07-25 LAB — OB RESULTS CONSOLE ANTIBODY SCREEN: Antibody Screen: NEGATIVE

## 2014-07-25 LAB — OB RESULTS CONSOLE RUBELLA ANTIBODY, IGM: RUBELLA: IMMUNE

## 2014-07-25 LAB — OB RESULTS CONSOLE RPR: RPR: NONREACTIVE

## 2014-07-25 LAB — OB RESULTS CONSOLE HIV ANTIBODY (ROUTINE TESTING): HIV: NONREACTIVE

## 2014-08-02 ENCOUNTER — Other Ambulatory Visit: Payer: Self-pay | Admitting: Obstetrics and Gynecology

## 2014-08-02 LAB — OB RESULTS CONSOLE GC/CHLAMYDIA
Chlamydia: NEGATIVE
Gonorrhea: NEGATIVE

## 2014-08-03 LAB — CYTOLOGY - PAP

## 2014-08-08 ENCOUNTER — Encounter (HOSPITAL_COMMUNITY): Payer: Self-pay | Admitting: Emergency Medicine

## 2014-09-05 IMAGING — CR DG FINGER THUMB 2+V*R*
3 series · 3 of 3 positions shown · non-contrast
Comparison: None.

CLINICAL DATA: Laceration

EXAM:
RIGHT THUMB 2+V

[x finger pa right]
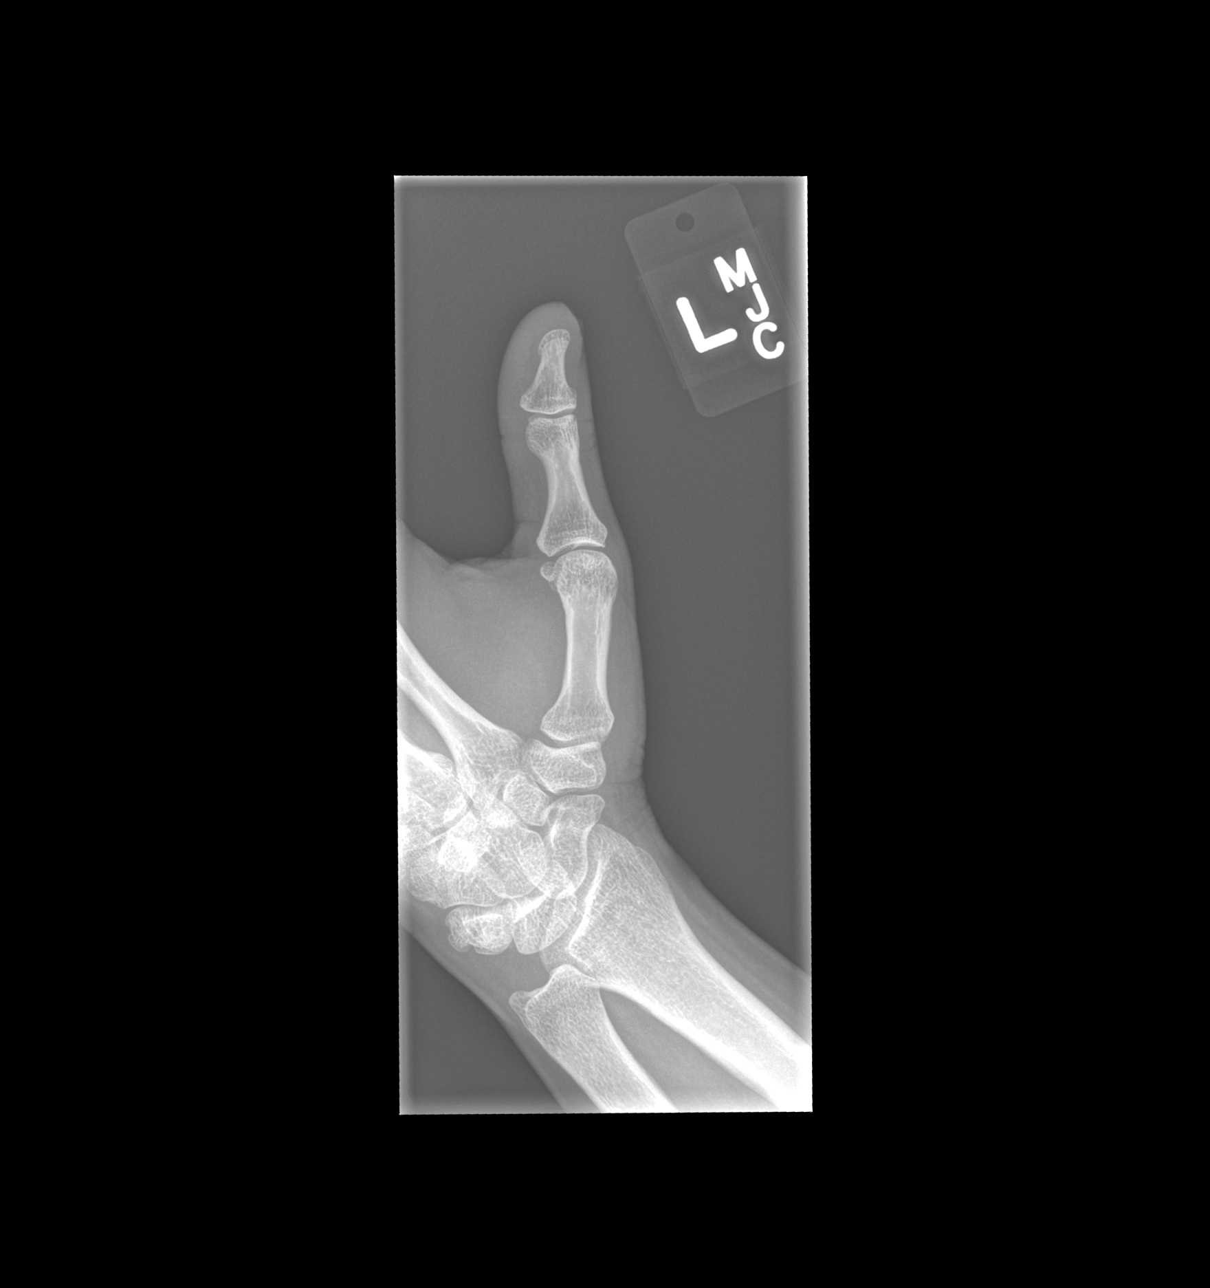

[x finger obl right]
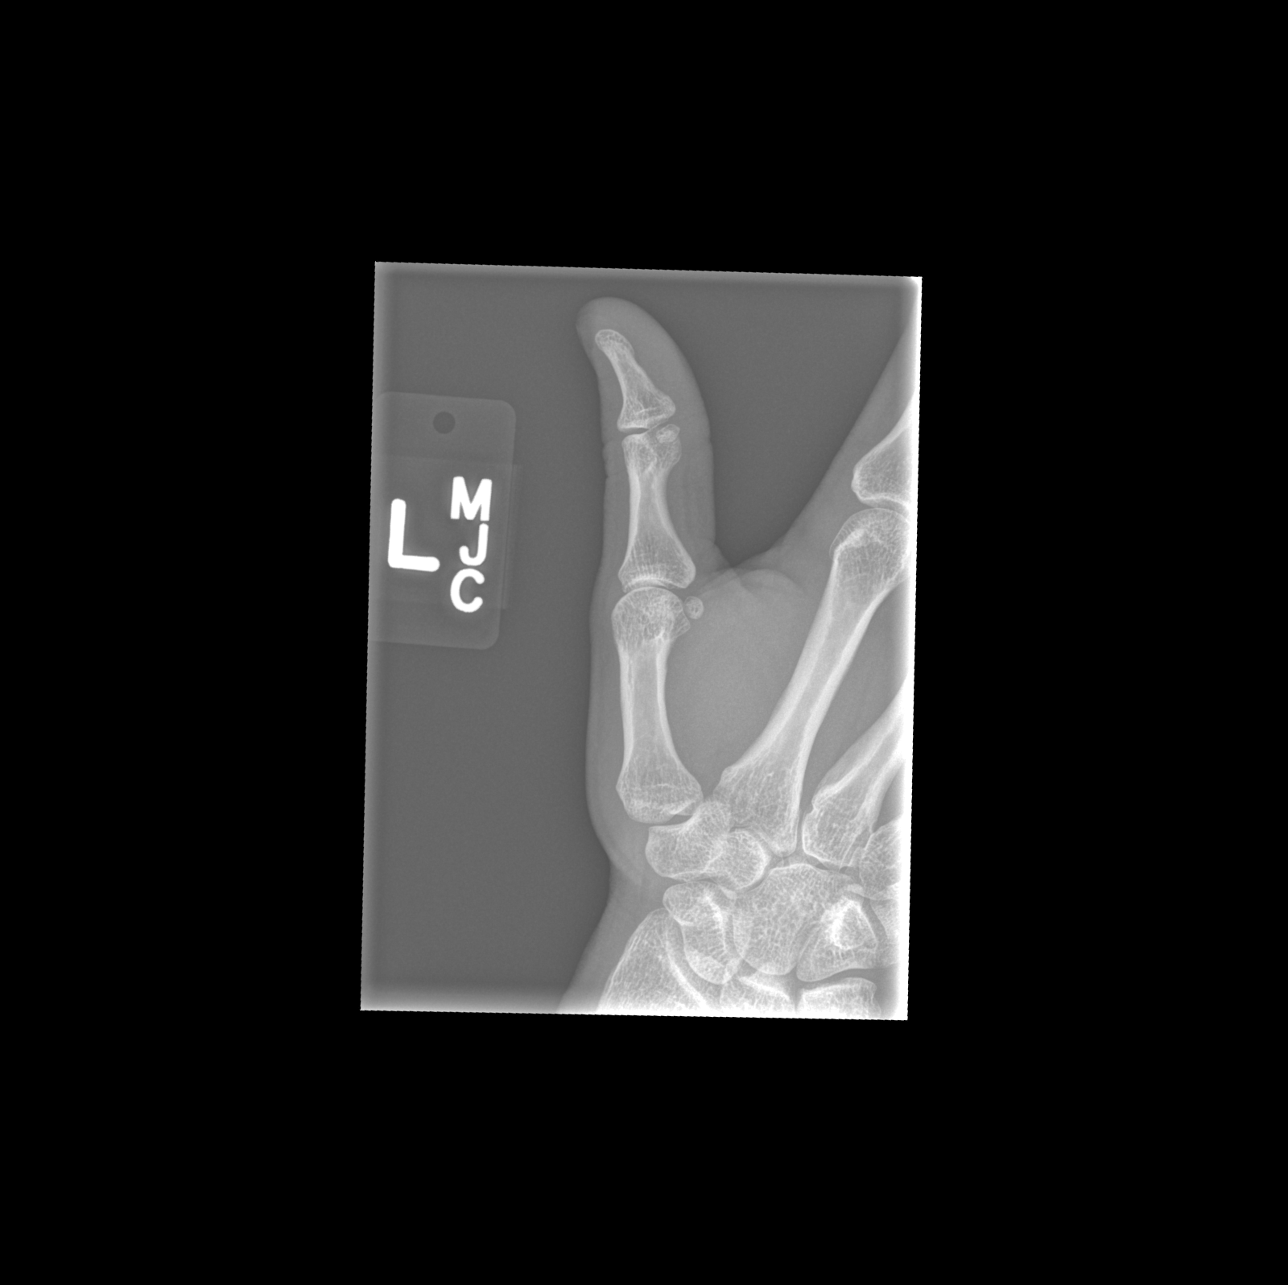

[x finger lat right]
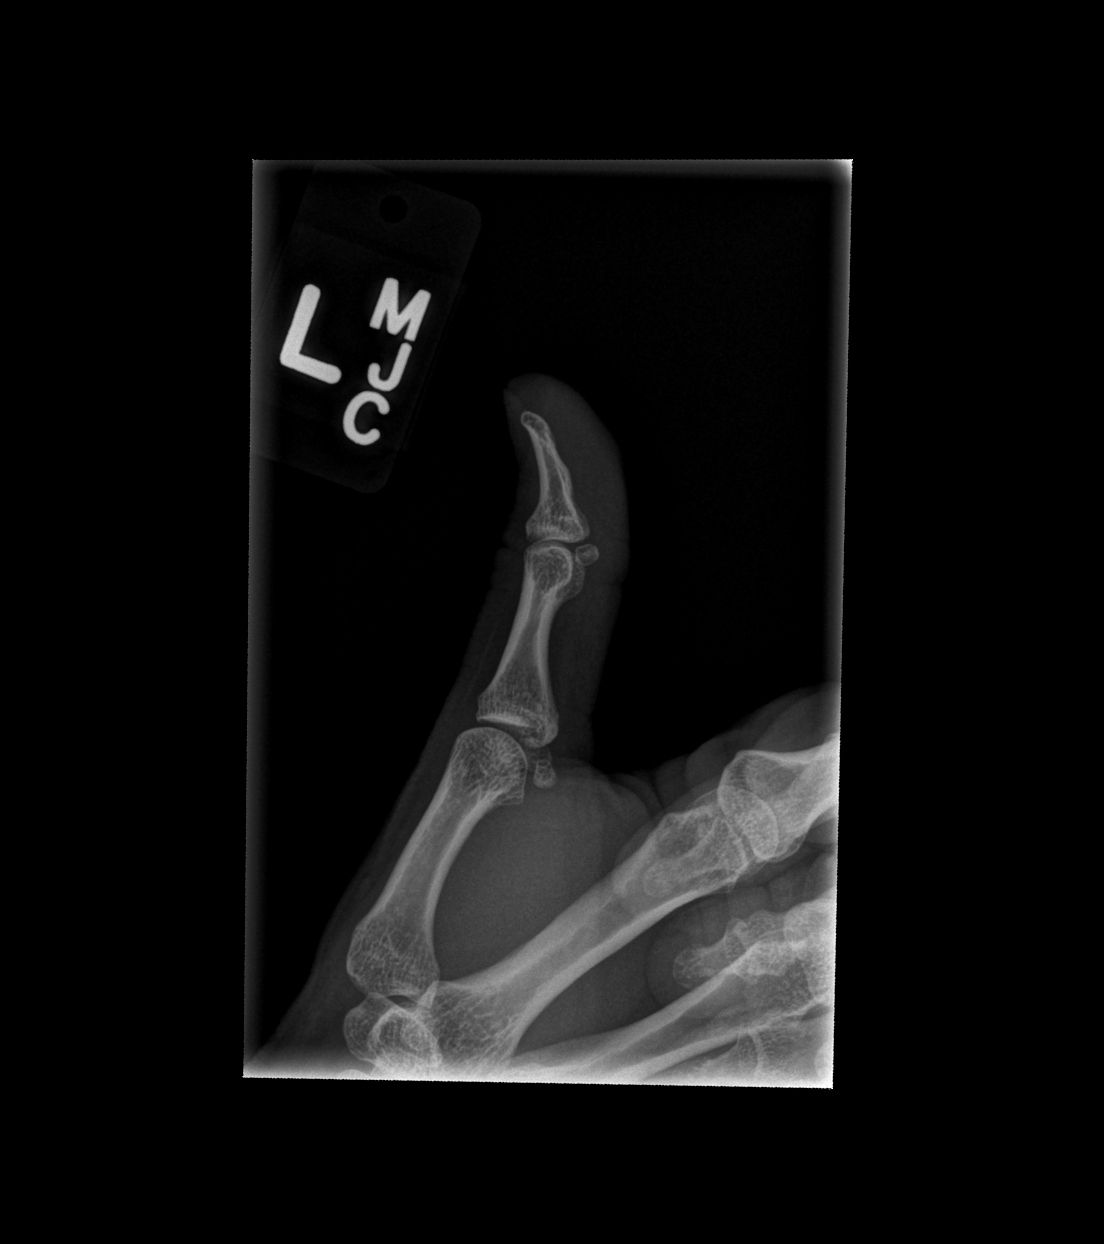

[3 of 3 positions shown; findings below may reference images not displayed]

FINDINGS: There is an apparent lucent soft tissue defect at the base of the
left thumb which could correspond to the reported laceration,
although soft tissue fold could appear similar. No radiopaque
foreign body. No fracture or dislocation.
IMPRESSION: Lucent soft tissue defect of the base of the thumb, correlate
clinically whether this represents the site of the clinically
reported laceration. No fracture identified.

## 2015-02-23 NOTE — Patient Instructions (Addendum)
Your procedure is scheduled on:  May 23,2016  Enter through the Main Entrance of [facility] at:  0600  Pick up the phone at the desk and dial [phone].  Call this number if you have problems the morning of surgery: [phone].  Remember: Do NOT eat food: after midnight on Sunday  Do NOT drink clear liquids after: after midnight on Sunday  Take these medicines the morning of surgery with a SIP OF WATER:  Zantac   Do NOT wear jewelry (body piercing), metal hair clips/bobby pins, or nail polish. Do NOT wear lotions, powders, or perfumes.  You may wear deoderant. Do NOT shave for 48 hours prior to surgery. Do NOT bring valuables to the hospital. Leave suitcase in car.  After surgery it may be brought to your room.  For patients admitted to the hospital, checkout time is 11:00 AM the day of discharge.

## 2015-02-24 ENCOUNTER — Encounter (HOSPITAL_COMMUNITY): Payer: Self-pay

## 2015-02-24 ENCOUNTER — Encounter (HOSPITAL_COMMUNITY)
Admission: RE | Admit: 2015-02-24 | Discharge: 2015-02-24 | Disposition: A | Discharge status: Home / Self Care | Payer: BLUE CROSS/BLUE SHIELD | Source: Ambulatory Visit | Attending: Obstetrics and Gynecology | Admitting: Obstetrics and Gynecology

## 2015-02-24 HISTORY — DX: Gastro-esophageal reflux disease without esophagitis: K21.9

## 2015-02-24 LAB — TYPE AND SCREEN
ABO/RH(D): A POS
ANTIBODY SCREEN: NEGATIVE

## 2015-02-24 LAB — CBC
HCT: 35.7 % — ABNORMAL LOW (ref 36.0–46.0)
HEMOGLOBIN: 12.1 g/dL (ref 12.0–15.0)
MCH: 31.8 pg (ref 26.0–34.0)
MCHC: 33.9 g/dL (ref 30.0–36.0)
MCV: 93.9 fL (ref 78.0–100.0)
Platelets: 199 10*3/uL (ref 150–400)
RBC: 3.8 MIL/uL — AB (ref 3.87–5.11)
RDW: 14 % (ref 11.5–15.5)
WBC: 9.1 10*3/uL (ref 4.0–10.5)

## 2015-02-24 LAB — ABO/RH: ABO/RH(D): A POS

## 2015-02-25 LAB — RPR: RPR Ser Ql: NONREACTIVE

## 2015-02-26 NOTE — H&P (Addendum)
Sabrina Reed is a 34 y.o. female presenting for repeat C/S.  Pregnancy uncomplicated.  Normal 1st trimester screen and neg GBS.  [facility] History OB History    Gravida Para Term Preterm AB TAB SAB Ectopic Multiple Living   2 1 1  0 0 0 0 0 0 1     Past Medical History  Diagnosis Date  . Newborn product of IVF pregnancy   . H/O varicella   . GERD (gastroesophageal reflux disease)     with pregnancy   Past Surgical History  Procedure Laterality Date  . Wisdom tooth extraction    . Cesarean section  10/07/2012    Procedure: CESAREAN SECTION;  Surgeon: Meriel Picaichard M Holland, MD;  Location: [facility];  Service: Obstetrics;  Laterality: N/A;  . Refractive surgery     Family History: family history includes Autoimmune disease in her cousin, maternal aunt, and mother; Cancer in her father, maternal grandmother, and mother; Mental retardation in her cousin; Thyroid disease in her mother. Social History:  reports that she has never smoked. She has never used smokeless tobacco. She reports that she does not drink alcohol or use illicit drugs.   Prenatal Transfer Tool  Maternal Diabetes: No Genetic Screening: Normal Maternal Ultrasounds/Referrals: Normal Fetal Ultrasounds or other Referrals:  None Maternal Substance Abuse:  No Significant Maternal Medications:  None Significant Maternal Lab Results:  Lab values include: Group B Strep negative Other Comments:  None  ROS    Last menstrual period 05/29/2014, unknown if currently breastfeeding. Exam Physical Exam Cx  Cl th high Prenatal labs: ABO, Rh: --/--/A POS, A POS (05/20 1150) Antibody: NEG (05/20 1150) Rubella: Immune (10/19 0000) RPR: Non Reactive (05/20 1150)  HBsAg: Negative (10/19 0000)  HIV: Non-reactive (10/19 0000)  GBS:     Assessment/Plan: IUP at 39 weeks Prev C/S desires repeat Risks and benefits of C/S were discussed.  All questions were answered and informed consent was obtained.  Plan to proceed with  low segment transverse Cesarean Section.   Kerwin Augustus C 02/26/2015, 11:36 PM       This patient has been seen and examined.   All of her questions were answered.  Labs and vital signs reviewed.  Informed consent has been obtained.  The History and Physical is current. 02/27/15 0715 DL

## 2015-02-26 NOTE — Anesthesia Preprocedure Evaluation (Addendum)
Anesthesia Evaluation  Patient identified by MRN, date of birth, ID band Patient awake    Reviewed: Allergy & Precautions, NPO status , Patient's Chart, lab work & pertinent test results  Airway Mallampati: III  TM Distance: >3 FB Neck ROM: Full    Dental no notable dental hx. (+) Teeth Intact   Pulmonary neg pulmonary ROS,  breath sounds clear to auscultation  Pulmonary exam normal       Cardiovascular negative cardio ROS Normal cardiovascular examRhythm:Regular Rate:Normal     Neuro/Psych negative neurological ROS  negative psych ROS   GI/Hepatic Neg liver ROS, GERD-  Medicated and Controlled,  Endo/Other  negative endocrine ROS  Renal/GU negative Renal ROS  negative genitourinary   Musculoskeletal negative musculoskeletal ROS (+)   Abdominal   Peds  Hematology negative hematology ROS (+)   Anesthesia Other Findings   Reproductive/Obstetrics (+) Pregnancy Previous C/Section IVF                            Anesthesia Physical Anesthesia Plan  ASA: II  Anesthesia Plan: Spinal   Post-op Pain Management:    Induction:   Airway Management Planned: Natural Airway  Additional Equipment:   Intra-op Plan:   Post-operative Plan:   Informed Consent: I have reviewed the patients History and Physical, chart, labs and discussed the procedure including the risks, benefits and alternatives for the proposed anesthesia with the patient or authorized representative who has indicated his/her understanding and acceptance.     Plan Discussed with: CRNA, Anesthesiologist and Surgeon  Anesthesia Plan Comments:         Anesthesia Quick Evaluation

## 2015-02-27 ENCOUNTER — Inpatient Hospital Stay (HOSPITAL_COMMUNITY): Payer: BLUE CROSS/BLUE SHIELD | Admitting: Anesthesiology

## 2015-02-27 ENCOUNTER — Encounter (HOSPITAL_COMMUNITY)
Admission: RE | Disposition: A | Discharge status: Home / Self Care | Payer: Self-pay | Source: Ambulatory Visit | Attending: Obstetrics and Gynecology

## 2015-02-27 ENCOUNTER — Inpatient Hospital Stay (HOSPITAL_COMMUNITY)
Admission: RE | Admit: 2015-02-27 | Discharge: 2015-03-01 | DRG: 766 | Disposition: A | Discharge status: Home / Self Care | Payer: BLUE CROSS/BLUE SHIELD | Source: Ambulatory Visit | Attending: Obstetrics and Gynecology | Admitting: Obstetrics and Gynecology

## 2015-02-27 ENCOUNTER — Encounter (HOSPITAL_COMMUNITY): Payer: Self-pay | Admitting: Emergency Medicine

## 2015-02-27 DIAGNOSIS — Z3A39 39 weeks gestation of pregnancy: Secondary | ICD-10-CM | POA: Diagnosis present

## 2015-02-27 DIAGNOSIS — O3421 Maternal care for scar from previous cesarean delivery: Secondary | ICD-10-CM | POA: Diagnosis present

## 2015-02-27 DIAGNOSIS — Z3483 Encounter for supervision of other normal pregnancy, third trimester: Secondary | ICD-10-CM | POA: Diagnosis present

## 2015-02-27 SURGERY — Surgical Case
Anesthesia: Spinal | Site: Abdomen

## 2015-02-27 MED ORDER — SIMETHICONE 80 MG PO CHEW
80.0000 mg | CHEWABLE_TABLET | ORAL | Status: DC
  Administered 2015-02-27 – 2015-03-01 (×2): 80 mg via ORAL
  Filled 2015-02-27 (×2): qty 1

## 2015-02-27 MED ORDER — SIMETHICONE 80 MG PO CHEW
80.0000 mg | CHEWABLE_TABLET | Freq: Three times a day (TID) | ORAL | Status: DC
  Administered 2015-02-27 – 2015-02-28 (×4): 80 mg via ORAL
  Filled 2015-02-27 (×5): qty 1

## 2015-02-27 MED ORDER — DIPHENHYDRAMINE HCL 50 MG/ML IJ SOLN: 12.5000 mg | INTRAMUSCULAR | Status: DC | PRN

## 2015-02-27 MED ORDER — NALBUPHINE HCL 10 MG/ML IJ SOLN: 5.0000 mg | Freq: Once | INTRAMUSCULAR | Status: AC | PRN

## 2015-02-27 MED ORDER — SCOPOLAMINE 1 MG/3DAYS TD PT72
MEDICATED_PATCH | TRANSDERMAL | Status: AC
  Administered 2015-02-27: 1.5 mg via TRANSDERMAL
  Filled 2015-02-27: qty 1

## 2015-02-27 MED ORDER — MORPHINE SULFATE (PF) 0.5 MG/ML IJ SOLN
INTRAMUSCULAR | Status: DC | PRN
  Administered 2015-02-27: .15 mg via INTRATHECAL

## 2015-02-27 MED ORDER — DEXTROSE 5 % IV SOLN
2.0000 g | INTRAVENOUS | Status: AC
  Administered 2015-02-27: 2 g via INTRAVENOUS
  Filled 2015-02-27: qty 2

## 2015-02-27 MED ORDER — DEXAMETHASONE SODIUM PHOSPHATE 4 MG/ML IJ SOLN
INTRAMUSCULAR | Status: AC
  Filled 2015-02-27: qty 1

## 2015-02-27 MED ORDER — FENTANYL CITRATE (PF) 100 MCG/2ML IJ SOLN: 25.0000 ug | INTRAMUSCULAR | Status: DC | PRN

## 2015-02-27 MED ORDER — WITCH HAZEL-GLYCERIN EX PADS: 1.0000 "application " | MEDICATED_PAD | CUTANEOUS | Status: DC | PRN

## 2015-02-27 MED ORDER — OXYTOCIN 10 UNIT/ML IJ SOLN
INTRAMUSCULAR | Status: AC
  Filled 2015-02-27: qty 4

## 2015-02-27 MED ORDER — SODIUM CHLORIDE 0.9 % IJ SOLN: 3.0000 mL | INTRAMUSCULAR | Status: DC | PRN

## 2015-02-27 MED ORDER — OXYTOCIN 40 UNITS IN LACTATED RINGERS INFUSION - SIMPLE MED: 62.5000 mL/h | INTRAVENOUS | Status: AC

## 2015-02-27 MED ORDER — OXYCODONE-ACETAMINOPHEN 5-325 MG PO TABS
1.0000 | ORAL_TABLET | ORAL | Status: DC | PRN
  Administered 2015-02-28 – 2015-03-01 (×2): 1 via ORAL
  Filled 2015-02-27: qty 1

## 2015-02-27 MED ORDER — LANOLIN HYDROUS EX OINT: 1.0000 "application " | TOPICAL_OINTMENT | CUTANEOUS | Status: DC | PRN

## 2015-02-27 MED ORDER — LACTATED RINGERS IV SOLN
INTRAVENOUS | Status: DC
  Administered 2015-02-27: 18:00:00 via INTRAVENOUS
  Administered 2015-02-28: 1 mL via INTRAVENOUS

## 2015-02-27 MED ORDER — DIPHENHYDRAMINE HCL 25 MG PO CAPS: 25.0000 mg | ORAL_CAPSULE | Freq: Four times a day (QID) | ORAL | Status: DC | PRN

## 2015-02-27 MED ORDER — KETOROLAC TROMETHAMINE 30 MG/ML IJ SOLN
30.0000 mg | Freq: Four times a day (QID) | INTRAMUSCULAR | Status: AC | PRN
  Administered 2015-02-27: 30 mg via INTRAMUSCULAR

## 2015-02-27 MED ORDER — NALOXONE HCL 1 MG/ML IJ SOLN
1.0000 ug/kg/h | INTRAVENOUS | Status: DC | PRN
  Filled 2015-02-27: qty 2

## 2015-02-27 MED ORDER — ACETAMINOPHEN 325 MG PO TABS: 650.0000 mg | ORAL_TABLET | ORAL | Status: DC | PRN

## 2015-02-27 MED ORDER — IBUPROFEN 600 MG PO TABS
600.0000 mg | ORAL_TABLET | Freq: Four times a day (QID) | ORAL | Status: DC
  Administered 2015-02-27 – 2015-03-01 (×6): 600 mg via ORAL
  Filled 2015-02-27 (×7): qty 1

## 2015-02-27 MED ORDER — FENTANYL CITRATE (PF) 100 MCG/2ML IJ SOLN
INTRAMUSCULAR | Status: AC
  Filled 2015-02-27: qty 2

## 2015-02-27 MED ORDER — BUPIVACAINE IN DEXTROSE 0.75-8.25 % IT SOLN
INTRATHECAL | Status: DC | PRN
  Administered 2015-02-27: 1.4 mL via INTRATHECAL

## 2015-02-27 MED ORDER — 0.9 % SODIUM CHLORIDE (POUR BTL) OPTIME
TOPICAL | Status: DC | PRN
  Administered 2015-02-27: 1000 mL

## 2015-02-27 MED ORDER — NALBUPHINE HCL 10 MG/ML IJ SOLN: 5.0000 mg | INTRAMUSCULAR | Status: DC | PRN

## 2015-02-27 MED ORDER — LACTATED RINGERS IV SOLN
INTRAVENOUS | Status: DC
  Administered 2015-02-27 (×2): via INTRAVENOUS

## 2015-02-27 MED ORDER — DEXAMETHASONE SODIUM PHOSPHATE 4 MG/ML IJ SOLN
INTRAMUSCULAR | Status: DC | PRN
  Administered 2015-02-27: 4 mg via INTRAVENOUS

## 2015-02-27 MED ORDER — SCOPOLAMINE 1 MG/3DAYS TD PT72
1.0000 | MEDICATED_PATCH | Freq: Once | TRANSDERMAL | Status: DC
  Administered 2015-02-27: 1.5 mg via TRANSDERMAL

## 2015-02-27 MED ORDER — MEPERIDINE HCL 25 MG/ML IJ SOLN: 6.2500 mg | INTRAMUSCULAR | Status: DC | PRN

## 2015-02-27 MED ORDER — MORPHINE SULFATE 0.5 MG/ML IJ SOLN
INTRAMUSCULAR | Status: AC
  Filled 2015-02-27: qty 10

## 2015-02-27 MED ORDER — KETOROLAC TROMETHAMINE 30 MG/ML IJ SOLN: 30.0000 mg | Freq: Four times a day (QID) | INTRAMUSCULAR | Status: AC | PRN

## 2015-02-27 MED ORDER — OXYTOCIN 10 UNIT/ML IJ SOLN
40.0000 [IU] | INTRAVENOUS | Status: DC | PRN
  Administered 2015-02-27: 40 [IU] via INTRAVENOUS

## 2015-02-27 MED ORDER — PHENYLEPHRINE 8 MG IN D5W 100 ML (0.08MG/ML) PREMIX OPTIME
INJECTION | INTRAVENOUS | Status: DC | PRN
  Administered 2015-02-27: 60 ug/min via INTRAVENOUS

## 2015-02-27 MED ORDER — IBUPROFEN 600 MG PO TABS
600.0000 mg | ORAL_TABLET | Freq: Four times a day (QID) | ORAL | Status: DC | PRN
  Administered 2015-03-01: 600 mg via ORAL

## 2015-02-27 MED ORDER — ONDANSETRON HCL 4 MG/2ML IJ SOLN
INTRAMUSCULAR | Status: AC
  Filled 2015-02-27: qty 2

## 2015-02-27 MED ORDER — NALOXONE HCL 0.4 MG/ML IJ SOLN: 0.4000 mg | INTRAMUSCULAR | Status: DC | PRN

## 2015-02-27 MED ORDER — VITAMIN K1 1 MG/0.5ML IJ SOLN
INTRAMUSCULAR | Status: AC
  Filled 2015-02-27: qty 0.5

## 2015-02-27 MED ORDER — ERYTHROMYCIN 5 MG/GM OP OINT
TOPICAL_OINTMENT | OPHTHALMIC | Status: AC
  Filled 2015-02-27: qty 1

## 2015-02-27 MED ORDER — DIPHENHYDRAMINE HCL 25 MG PO CAPS: 25.0000 mg | ORAL_CAPSULE | ORAL | Status: DC | PRN

## 2015-02-27 MED ORDER — KETOROLAC TROMETHAMINE 30 MG/ML IJ SOLN
INTRAMUSCULAR | Status: AC
  Administered 2015-02-27: 30 mg via INTRAMUSCULAR
  Filled 2015-02-27: qty 1

## 2015-02-27 MED ORDER — ONDANSETRON HCL 4 MG/2ML IJ SOLN: 4.0000 mg | Freq: Three times a day (TID) | INTRAMUSCULAR | Status: DC | PRN

## 2015-02-27 MED ORDER — DEXAMETHASONE SODIUM PHOSPHATE 4 MG/ML IJ SOLN
INTRAMUSCULAR | Status: AC
  Filled 2015-02-27: qty 2

## 2015-02-27 MED ORDER — ONDANSETRON HCL 4 MG/2ML IJ SOLN
INTRAMUSCULAR | Status: DC | PRN
  Administered 2015-02-27: 4 mg via INTRAVENOUS

## 2015-02-27 MED ORDER — MENTHOL 3 MG MT LOZG: 1.0000 | LOZENGE | OROMUCOSAL | Status: DC | PRN

## 2015-02-27 MED ORDER — OXYCODONE-ACETAMINOPHEN 5-325 MG PO TABS
2.0000 | ORAL_TABLET | ORAL | Status: DC | PRN
  Administered 2015-02-28 – 2015-03-01 (×2): 2 via ORAL
  Filled 2015-02-27 (×3): qty 2

## 2015-02-27 MED ORDER — FENTANYL CITRATE (PF) 100 MCG/2ML IJ SOLN
INTRAMUSCULAR | Status: DC | PRN
  Administered 2015-02-27: 25 ug via INTRATHECAL

## 2015-02-27 MED ORDER — PHENYLEPHRINE 8 MG IN D5W 100 ML (0.08MG/ML) PREMIX OPTIME
INJECTION | INTRAVENOUS | Status: AC
  Filled 2015-02-27: qty 100

## 2015-02-27 MED ORDER — DIBUCAINE 1 % RE OINT: 1.0000 "application " | TOPICAL_OINTMENT | RECTAL | Status: DC | PRN

## 2015-02-27 MED ORDER — PRENATAL MULTIVITAMIN CH
1.0000 | ORAL_TABLET | Freq: Every day | ORAL | Status: DC
  Administered 2015-02-28: 1 via ORAL
  Filled 2015-02-27: qty 1

## 2015-02-27 MED ORDER — SENNOSIDES-DOCUSATE SODIUM 8.6-50 MG PO TABS
2.0000 | ORAL_TABLET | ORAL | Status: DC
  Administered 2015-02-27 – 2015-03-01 (×2): 2 via ORAL
  Filled 2015-02-27 (×2): qty 2

## 2015-02-27 MED ORDER — ZOLPIDEM TARTRATE 5 MG PO TABS: 5.0000 mg | ORAL_TABLET | Freq: Every evening | ORAL | Status: DC | PRN

## 2015-02-27 MED ORDER — SIMETHICONE 80 MG PO CHEW: 80.0000 mg | CHEWABLE_TABLET | ORAL | Status: DC | PRN

## 2015-02-27 MED ORDER — TETANUS-DIPHTH-ACELL PERTUSSIS 5-2.5-18.5 LF-MCG/0.5 IM SUSP: 0.5000 mL | Freq: Once | INTRAMUSCULAR | Status: DC

## 2015-02-27 SURGICAL SUPPLY — 23 items
CLAMP CORD UMBIL (MISCELLANEOUS) ×3 IMPLANT
CLOTH BEACON ORANGE TIMEOUT ST (SAFETY) ×3 IMPLANT
DRAPE SHEET LG 3/4 BI-LAMINATE (DRAPES) ×3 IMPLANT
DRSG OPSITE POSTOP 4X10 (GAUZE/BANDAGES/DRESSINGS) ×3 IMPLANT
DURAPREP 26ML APPLICATOR (WOUND CARE) ×3 IMPLANT
ELECT REM PT RETURN 9FT ADLT (ELECTROSURGICAL) ×3
ELECTRODE REM PT RTRN 9FT ADLT (ELECTROSURGICAL) ×1 IMPLANT
EXTRACTOR VACUUM M CUP 4 TUBE (SUCTIONS) ×2 IMPLANT
EXTRACTOR VACUUM M CUP 4' TUBE (SUCTIONS) ×1
GLOVE SURG ORTHO 8.0 STRL STRW (GLOVE) ×3 IMPLANT
GOWN STRL REUS W/TWL LRG LVL3 (GOWN DISPOSABLE) ×6 IMPLANT
KIT ABG SYR 3ML LUER SLIP (SYRINGE) ×3 IMPLANT
NEEDLE HYPO 25X5/8 SAFETYGLIDE (NEEDLE) ×3 IMPLANT
NS IRRIG 1000ML POUR BTL (IV SOLUTION) ×3 IMPLANT
PACK C SECTION WH (CUSTOM PROCEDURE TRAY) ×3 IMPLANT
PAD OB MATERNITY 4.3X12.25 (PERSONAL CARE ITEMS) ×3 IMPLANT
SUT MNCRL 0 VIOLET CTX 36 (SUTURE) ×3 IMPLANT
SUT MON AB 4-0 PS1 27 (SUTURE) ×3 IMPLANT
SUT MONOCRYL 0 CTX 36 (SUTURE) ×6
SUT VIC AB 1 CTX 36 (SUTURE) ×2
SUT VIC AB 1 CTX36XBRD ANBCTRL (SUTURE) ×1 IMPLANT
TOWEL OR 17X24 6PK STRL BLUE (TOWEL DISPOSABLE) ×3 IMPLANT
TRAY FOLEY CATH SILVER 14FR (SET/KITS/TRAYS/PACK) ×3 IMPLANT

## 2015-02-27 NOTE — Op Note (Signed)
Cesarean Section Procedure Note  Pre-operative Diagnosis: IUP at 39 weeks, previous C/S for repeat  Post-operative Diagnosis: same  Surgeon: Turner DanielsLOWE,Delita Chiquito C   Assistants: none  Anesthesia:spinal  Procedure:  Low Segment Transverse cesarean section  Procedure Details  The patient was seen in the Holding Room. The risks, benefits, complications, treatment options, and expected outcomes were discussed with the patient.  The patient concurred with the proposed plan, giving informed consent.  The site of surgery properly noted/marked.. A Time Out was held and the above information confirmed.  After induction of anesthesia, the patient was draped and prepped in the usual sterile manner. A Pfannenstiel incision was made and carried down through the subcutaneous tissue to the fascia. Fascial incision was made and extended transversely. The fascia was separated from the underlying rectus tissue superiorly and inferiorly. The peritoneum was identified and entered. Peritoneal incision was extended longitudinally. The utero-vesical peritoneal reflection was incised transversely and the bladder flap was bluntly freed from the lower uterine segment. A low transverse uterine incision was made. Delivered from vertex presentation with one easy pull of VTE was a baby with Apgar scores of 9 at one minute and 9 at five minutes. After the umbilical cord was clamped and cut cord blood was obtained for evaluation. The placenta was removed intact and appeared normal. The uterine outline, tubes and ovaries appeared normal. The uterine incision was closed with running locked sutures of 0 monocryl and imbricated with 0 monocryl. Hemostasis was observed. Lavage was carried out until clear. The peritoneum was then closed with 0 monocryl and rectus muscles plicated in the midline.  After hemostasis was assured, the fascia was then reapproximated with running sutures of 0 Vicryl. Irrigation was applied and after adequate hemostasis  was assured, the skin was reapproximated with SQ 4-0 monocryl.  Instrument, sponge, and needle counts were correct prior the abdominal closure and at the conclusion of the case. The patient received 2 grams cefotetan preoperatively.  Findings: Viable female  Estimated Blood Loss:  800cc         Specimens: Placenta was sent to L&D         Complications:  None

## 2015-02-27 NOTE — Anesthesia Procedure Notes (Signed)
Spinal Patient location during procedure: OR Start time: 02/27/2015 7:32 AM Staffing Anesthesiologist: Mal AmabileFOSTER, Lanyiah Brix Performed by: anesthesiologist  Preanesthetic Checklist Completed: patient identified, site marked, surgical consent, pre-op evaluation, timeout performed, IV checked, risks and benefits discussed and monitors and equipment checked Spinal Block Patient position: sitting Prep: site prepped and draped and DuraPrep Patient monitoring: heart rate, cardiac monitor, continuous pulse ox and blood pressure Approach: midline Location: L3-4 Injection technique: single-shot Needle Needle type: Sprotte  Needle gauge: 24 G Needle length: 9 cm Needle insertion depth: 5 cm Assessment Sensory level: T4 Additional Notes Patient tolerated procedure well. Adequate sensory level.

## 2015-02-27 NOTE — Transfer of Care (Signed)
Immediate Anesthesia Transfer of Care Note  Patient: Sabrina BaarsKelly M Drotar  Procedure(s) Performed: Procedure(s) with comments: CESAREAN SECTION (N/A) - Repeat edc 5/29  Patient Location: PACU  Anesthesia Type:Spinal  Level of Consciousness: awake, alert  and oriented  Airway & Oxygen Therapy: Patient Spontanous Breathing  Post-op Assessment: Report given to RN and Post -op Vital signs reviewed and stable  Post vital signs: Reviewed and stable  Last Vitals:  Filed Vitals:   02/27/15 0602  BP: 108/77  Pulse: 78  Temp: 36.8 C  Resp: 16    Complications: No apparent anesthesia complications

## 2015-02-27 NOTE — Anesthesia Postprocedure Evaluation (Signed)
  Anesthesia Post-op Note  Patient: Sabrina Reed  Procedure(s) Performed: Procedure(s) with comments: CESAREAN SECTION (N/A) - Repeat edc 5/29  Patient Location: PACU  Anesthesia Type:Spinal  Level of Consciousness: awake, alert  and oriented  Airway and Oxygen Therapy: Patient Spontanous Breathing  Post-op Pain: none  Post-op Assessment: Post-op Vital signs reviewed, Patient's Cardiovascular Status Stable, Respiratory Function Stable, Patent Airway, No signs of Nausea or vomiting, Pain level controlled, No headache and No backache  Post-op Vital Signs: Reviewed and stable  Last Vitals:  Filed Vitals:   02/27/15 0930  BP: 103/68  Pulse: 57  Temp: 36.5 C  Resp: 14    Complications: No apparent anesthesia complications

## 2015-02-27 NOTE — Anesthesia Postprocedure Evaluation (Signed)
  Anesthesia Post-op Note  Patient: Sabrina BaarsKelly M Reed  Procedure(s) Performed: Procedure(s) with comments: CESAREAN SECTION (N/A) - Repeat edc 5/29  Patient Location: Mother/Baby  Anesthesia Type:Spinal  Level of Consciousness: awake, alert  and oriented  Airway and Oxygen Therapy: Patient Spontanous Breathing  Post-op Pain: none  Post-op Assessment: Post-op Vital signs reviewed and Patient's Cardiovascular Status Stable  Post-op Vital Signs: Reviewed and stable  Last Vitals:  Filed Vitals:   02/27/15 1300  BP: 96/63  Pulse: 61  Temp:   Resp: 16    Complications: No apparent anesthesia complications

## 2015-02-27 NOTE — Lactation Note (Signed)
This note was copied from the chart of [name]. Lactation Consultation Note  Patient Name: [name] Today's Date: 02/27/2015 Reason for consult: Initial assessment Baby nursing on left breast when I arrived demonstrating a good rhythmic suck with some swallowing motions noted. Mom is experienced BF. Reviewed some basic teaching, encouraged to BF with feeding ques. Lactation brochure left for review, advised of OP services and support group. Mom denies other questions/concerns. Encouraged to call for assist as needed.   Maternal Data Has patient been taught Hand Expression?: Yes Does the patient have breastfeeding experience prior to this delivery?: Yes  Feeding Feeding Type: Breast Fed Length of feed: 15 min  LATCH Score/Interventions Latch: Grasps breast easily, tongue down, lips flanged, rhythmical sucking.  Audible Swallowing: A few with stimulation Intervention(s): Skin to skin  Type of Nipple: Everted at rest and after stimulation  Comfort (Breast/Nipple): Soft / non-tender     Hold (Positioning): No assistance needed to correctly position infant at breast.  LATCH Score: 9  Lactation Tools Discussed/Used     Consult Status Consult Status: Follow-up Date: 02/28/15 Follow-up type: In-patient    Alfred LevinsGranger, Bailie Christenbury Ann 02/27/2015, 10:54 AM

## 2015-02-28 ENCOUNTER — Encounter (HOSPITAL_COMMUNITY): Payer: Self-pay | Admitting: Obstetrics and Gynecology

## 2015-02-28 LAB — CBC
HCT: 31.5 % — ABNORMAL LOW (ref 36.0–46.0)
Hemoglobin: 10.5 g/dL — ABNORMAL LOW (ref 12.0–15.0)
MCH: 31.1 pg (ref 26.0–34.0)
MCHC: 33.3 g/dL (ref 30.0–36.0)
MCV: 93.2 fL (ref 78.0–100.0)
PLATELETS: 186 10*3/uL (ref 150–400)
RBC: 3.38 MIL/uL — AB (ref 3.87–5.11)
RDW: 14 % (ref 11.5–15.5)
WBC: 14.8 10*3/uL — ABNORMAL HIGH (ref 4.0–10.5)

## 2015-02-28 LAB — BIRTH TISSUE RECOVERY COLLECTION (PLACENTA DONATION)

## 2015-02-28 LAB — CCBB MATERNAL DONOR DRAW

## 2015-02-28 NOTE — Progress Notes (Signed)
Subjective: Postpartum Day 1: Cesarean Delivery Patient reports tolerating PO and no problems voiding.    Objective: Vital signs in last 24 hours: Temp:  [97.4 F (36.3 C)-98 F (36.7 C)] 97.8 F (36.6 C) (05/24 0446) Pulse Rate:  [56-62] 56 (05/24 0446) Resp:  [14-20] 18 (05/24 0446) BP: (85-115)/(42-73) 99/47 mmHg (05/24 0446) SpO2:  [91 %-98 %] 96 % (05/24 0158) Weight:  [162 lb (73.483 kg)] 162 lb (73.483 kg) (05/23 0955)  Physical Exam:  General: alert and cooperative Lochia: appropriate Uterine Fundus: firm Incision: healing well DVT Evaluation: No evidence of DVT seen on physical exam. Negative Homan's sign. No cords or calf tenderness. No significant calf/ankle edema.   Recent Labs  02/28/15 0635  HGB 10.5*  HCT 31.5*    Assessment/Plan: Status post Cesarean section. Doing well postoperatively.  Continue current care.  Landrie Beale G 02/28/2015, 7:50 AM

## 2015-03-01 MED ORDER — OXYCODONE-ACETAMINOPHEN 5-325 MG PO TABS: 1.0000 | ORAL_TABLET | ORAL | Status: DC | PRN

## 2015-03-01 MED ORDER — IBUPROFEN 600 MG PO TABS: 600.0000 mg | ORAL_TABLET | Freq: Four times a day (QID) | ORAL | Status: DC | PRN

## 2015-03-01 NOTE — Progress Notes (Signed)
Went to round on patient at 0915 but she had already gone home.     Mitchel HonourMegan Aryahi Denzler, DO

## 2015-03-01 NOTE — Lactation Note (Signed)
This note was copied from the chart of Sabrina Reed Demauro. Lactation Consultation Note; Experienced BF mom. Giving formula as I went into room. Mom reports she has been feeding for 2 hours and is not getting enough. Still acting hungry. Encouraged to give small amounts and continue frequent feedings. Mom reports breasts are just beginning to feel fuller. No questions at present. To call prn  Patient Name: Sabrina Reed Dufford ZOXWR'UToday's Date: 03/01/2015 Reason for consult: Follow-up assessment   Maternal Data Formula Feeding for Exclusion: No Does the patient have breastfeeding experience prior to this delivery?: Yes  Feeding   LATCH Score/Interventions   Lactation Tools Discussed/Used     Consult Status Consult Status: Complete    Pamelia HoitWeeks, Gabe Glace D 03/01/2015, 8:31 AM

## 2015-03-01 NOTE — Discharge Summary (Signed)
Obstetric Discharge Summary Reason for Admission: cesarean section Prenatal Procedures: ultrasound Intrapartum Procedures: cesarean: low cervical, transverse Postpartum Procedures: none Complications-Operative and Postpartum: none HEMOGLOBIN  Date Value Ref Range Status  02/28/2015 10.5* 12.0 - 15.0 g/dL Final   HCT  Date Value Ref Range Status  02/28/2015 31.5* 36.0 - 46.0 % Final    Physical Exam:  General: alert and cooperative Lochia: appropriate Uterine Fundus: firm Incision: healing well DVT Evaluation: No evidence of DVT seen on physical exam. Negative Homan's sign. No cords or calf tenderness. No significant calf/ankle edema.  Discharge Diagnoses: Term Pregnancy-delivered  Discharge Information: Date: 03/01/2015 Activity: pelvic rest Diet: routine Medications: PNV, Ibuprofen and Percocet Condition: stable Instructions: refer to practice specific booklet Discharge to: home   Newborn Data: Live born female  Birth Weight: 9 lb 2.7 oz (4160 g) APGAR: 8, 9  Home with mother.  CURTIS,CAROL G 03/01/2015, 8:01 AM

## 2015-04-04 ENCOUNTER — Other Ambulatory Visit: Payer: Self-pay | Admitting: Obstetrics and Gynecology

## 2015-04-06 LAB — CYTOLOGY - PAP

## 2016-08-06 ENCOUNTER — Telehealth: Payer: Self-pay | Admitting: Genetic Counselor

## 2016-08-06 ENCOUNTER — Encounter: Payer: Self-pay | Admitting: Genetic Counselor

## 2016-08-06 NOTE — Telephone Encounter (Signed)
Appt scheduled w/Karen Lowell GuitarPowell on 12/13 at 9am. Pt aware to arrive 15 minutes early. Demographics verified. Letter mailed to the pt.

## 2016-09-16 ENCOUNTER — Telehealth: Payer: Self-pay | Admitting: Genetic Counselor

## 2016-09-16 NOTE — Telephone Encounter (Signed)
Lt vm reschedule appt for 12/13

## 2016-09-18 ENCOUNTER — Ambulatory Visit (HOSPITAL_BASED_OUTPATIENT_CLINIC_OR_DEPARTMENT_OTHER): Payer: BLUE CROSS/BLUE SHIELD | Admitting: Genetic Counselor

## 2016-09-18 ENCOUNTER — Other Ambulatory Visit: Payer: BLUE CROSS/BLUE SHIELD

## 2016-09-18 ENCOUNTER — Encounter: Payer: Self-pay | Admitting: Genetic Counselor

## 2016-09-18 DIAGNOSIS — Z8042 Family history of malignant neoplasm of prostate: Secondary | ICD-10-CM

## 2016-09-18 DIAGNOSIS — Z808 Family history of malignant neoplasm of other organs or systems: Secondary | ICD-10-CM

## 2016-09-18 DIAGNOSIS — Z803 Family history of malignant neoplasm of breast: Secondary | ICD-10-CM

## 2016-09-18 DIAGNOSIS — Z806 Family history of leukemia: Secondary | ICD-10-CM | POA: Diagnosis not present

## 2016-09-18 DIAGNOSIS — Z8051 Family history of malignant neoplasm of kidney: Secondary | ICD-10-CM

## 2016-09-18 DIAGNOSIS — Z315 Encounter for genetic counseling: Secondary | ICD-10-CM

## 2016-09-18 NOTE — Progress Notes (Signed)
REFERRING PROVIDER: Fanny Bien, MD [address] [place], [place] [address]   Linda Hedges, DO  PRIMARY PROVIDER:  Rachell Cipro, MD  PRIMARY REASON FOR VISIT:  1. Family history of breast cancer   2. Family history of kidney cancer      HISTORY OF PRESENT ILLNESS:   Sabrina Reed, a 35 y.o. female, was seen for a [facility] cancer genetics consultation at the request of Dr. Lynnette Caffey due to a family history of cancer.  Sabrina Reed presents to clinic today to discuss the possibility of a hereditary predisposition to cancer, genetic testing, and to further clarify her future cancer risks, as well as potential cancer risks for family members.  Sabrina Reed is a 35 y.o. female with no personal history of cancer.    CANCER HISTORY:   No history exists.     HORMONAL RISK FACTORS:  Menarche was at age 63.  First live birth at age 53.  OCP use for approximately 5-6 years.  Ovaries intact: yes.  Hysterectomy: no.  Menopausal status: premenopausal.  HRT use: 0 years. Colonoscopy: no; not examined. Mammogram within the last year: no. Number of breast biopsies: 0. Up to date with pelvic exams:  yes. Any excessive radiation exposure in the past:  no  Past Medical History:  Diagnosis Date  . Family history of breast cancer   . Family history of kidney cancer   . GERD (gastroesophageal reflux disease)    with pregnancy  . H/O varicella   . Newborn product of IVF pregnancy     Past Surgical History:  Procedure Laterality Date  . CESAREAN SECTION  10/07/2012   Procedure: CESAREAN SECTION;  Surgeon: Margarette Asal, MD;  Location: [facility];  Service: Obstetrics;  Laterality: N/A;  . CESAREAN SECTION N/A 02/27/2015   Procedure: CESAREAN SECTION;  Surgeon: Louretta Shorten, MD;  Location: [facility];  Service: Obstetrics;  Laterality: N/A;  Repeat edc 5/29  . REFRACTIVE SURGERY    . WISDOM TOOTH EXTRACTION      Social History   Social History  . Marital status: Married    Spouse  name: N/A  . Number of children: N/A  . Years of education: N/A   Social History Main Topics  . Smoking status: Never Smoker  . Smokeless tobacco: Never Used  . Alcohol use No  . Drug use: No  . Sexual activity: Yes   Other Topics Concern  . None   Social History Narrative  . None     FAMILY HISTORY:  We obtained a detailed, 4-generation family history.  Significant diagnoses are listed below: Family History  Problem Relation Age of Onset  . Thyroid disease Mother   . Autoimmune disease Mother   . Cancer Mother 9    kidney  . Mental retardation Cousin   . Autoimmune disease Cousin   . Cancer Father 61    prostate  . Autoimmune disease Maternal Aunt   . Breast cancer Maternal Aunt 68  . Cancer Maternal Grandmother 40    breast  . Prostate cancer Maternal Grandfather 70  . Melanoma Maternal Grandfather   . Leukemia Maternal Grandfather   . Drug abuse Paternal Grandmother   . Drug abuse Paternal Grandfather   . Alcohol abuse Paternal Grandfather     The patient has two children who are cancer free.  Her brother is alive and cancer free, he has one daughter who is 71 weeks old.  The patient's father developed prostate cancer at  55.  He is adopted.  Her mother was diagnosed with kidney cancer at 10.  This was treated with nephrectomy and removal of her spleen.  The cancer recurred at age 63.  The patient's mother has two brothers and a sister.  The sister was diagnosed with bilateral breast cancer at 54.  The patient's maternal grandmother developed breast cancer at 14 and died at 37.  Her grandfather had prostate cancer at 9, several melanoma's at unknown ages, and leukemia.    Sabrina Reed is unaware of previous family history of genetic testing for hereditary cancer risks. Patient's maternal ancestors are of [place], [place] and [place] descent, and paternal ancestors are of Caucasian descent. There is no reported Ashkenazi Jewish ancestry. There is no known  consanguinity.  GENETIC COUNSELING ASSESSMENT: Sabrina Reed is a 35 y.o. female with a family history of breast, prostate and kidney cancer which is somewhat suggestive of a hereditary cancer syndrome and predisposition to cancer. We, therefore, discussed and recommended the following at today's visit.   DISCUSSION: We discussed that about 5-10% of breast cancer is hereditary with most cases from BRCA mutations.  About 3-5% of kidney cancer is hereditary.  There are several genes that are associated with kidney cancer, including PTEN, MET and VHL, but at this time we are not aware that there is one particular gene that is most commonly associated with kidney cancer.  Other genes associated with hereditary breast cancer syndromes include ATM, CHEK2 and PALB2.  We reviewed the characteristics, features and inheritance patterns of hereditary cancer syndromes. We also discussed genetic testing, including the appropriate family members to test, the process of testing, insurance coverage and turn-around-time for results. We discussed the implications of a negative, positive and/or variant of uncertain significant result. We recommended Sabrina Reed pursue genetic testing for the Common Hereditary gene panel. The Hereditary Gene Panel offered by [facility] includes sequencing and/or deletion duplication testing of the following 42 genes: APC, ATM, AXIN2, BARD1, BMPR1A, BRCA1, BRCA2, BRIP1, CDH1, CDKN2A, CHEK2, DICER1, EPCAM, GREM1, KIT, MEN1, MLH1, MSH2, MSH6, MUTYH, NBN, NF1, PALB2, PDGFRA, PMS2, POLD1, POLE, PTEN, RAD50, RAD51C, RAD51D, SDHA, SDHB, SDHC, SDHD, SMAD4, SMARCA4. STK11, TP53, TSC1, TSC2, and VHL.      Based on Sabrina Reed's family history of cancer, she meets medical criteria for genetic testing. Despite that she meets criteria, she may still have an out of pocket cost. We discussed that if her out of pocket cost for testing is over $100, the laboratory will call and confirm whether she wants to proceed  with testing.  If the out of pocket cost of testing is less than $100 she will be billed by the genetic testing laboratory.   We discussed that even though Sabrina Reed meets criteria for testing, she is not the most informative person in the family to test.  Based on the diagnosis of early breast cancer in her grandmother, and the breast cancer in her maternal aunt and kidney cancer in her mother, either her mother or her aunt would be more informative people in the family to test.  Sabrina Reed felt that her mother would be agreeable to going through testing, especially since it would then pertain to both she and her brother. She will ask her mother to see if she would be willing to undergo genetic testing.  PLAN: Despite our recommendation, Sabrina Reed did not wish to pursue genetic testing at today's visit. Instead, she will ask her mother and/or her aunt to see if  they would be willing to undergo genetic testing.  We understand and support this decision, and remain available to coordinate genetic testing at any time in the future.  We would be willing to coordinate for either Sabrina Reed or her family members.  We therefore, recommend Sabrina Reed continue to follow the cancer screening guidelines given by her primary healthcare provider.  Lastly, we encouraged Sabrina Reed to remain in contact with cancer genetics annually so that we can continuously update the family history and inform her of any changes in cancer genetics and testing that may be of benefit for this family.   Ms.  Reed questions were answered to her satisfaction today. Our contact information was provided should additional questions or concerns arise. Thank you for the referral and allowing [place] to share in the care of your patient.   Karen P. Florene Glen, [place], [facility] Certified Genetic Counselor [email or web address] phone: [phone]  The patient was seen for a total of 35 minutes in face-to-face genetic counseling.  This patient was  discussed with Drs. Magrinat, Lindi Adie and/or Burr Medico who agrees with the above.    _______________________________________________________________________ For Office Staff:  Number of people involved in session: 1 Was an Intern/ student involved with case: no

## 2017-07-06 DIAGNOSIS — Z23 Encounter for immunization: Secondary | ICD-10-CM | POA: Diagnosis not present

## 2017-08-19 DIAGNOSIS — Z01419 Encounter for gynecological examination (general) (routine) without abnormal findings: Secondary | ICD-10-CM | POA: Diagnosis not present

## 2017-08-19 DIAGNOSIS — Z6822 Body mass index (BMI) 22.0-22.9, adult: Secondary | ICD-10-CM | POA: Diagnosis not present

## 2018-05-30 DIAGNOSIS — J069 Acute upper respiratory infection, unspecified: Secondary | ICD-10-CM | POA: Diagnosis not present

## 2018-05-30 DIAGNOSIS — R05 Cough: Secondary | ICD-10-CM | POA: Diagnosis not present

## 2018-10-01 DIAGNOSIS — M549 Dorsalgia, unspecified: Secondary | ICD-10-CM | POA: Diagnosis not present

## 2018-10-01 DIAGNOSIS — Z6824 Body mass index (BMI) 24.0-24.9, adult: Secondary | ICD-10-CM | POA: Diagnosis not present

## 2018-10-06 DIAGNOSIS — M546 Pain in thoracic spine: Secondary | ICD-10-CM | POA: Diagnosis not present

## 2018-10-06 DIAGNOSIS — M545 Low back pain: Secondary | ICD-10-CM | POA: Diagnosis not present

## 2018-10-15 DIAGNOSIS — Z6823 Body mass index (BMI) 23.0-23.9, adult: Secondary | ICD-10-CM | POA: Diagnosis not present

## 2018-10-15 DIAGNOSIS — Z01419 Encounter for gynecological examination (general) (routine) without abnormal findings: Secondary | ICD-10-CM | POA: Diagnosis not present

## 2022-05-26 ENCOUNTER — Encounter (HOSPITAL_BASED_OUTPATIENT_CLINIC_OR_DEPARTMENT_OTHER): Payer: Self-pay

## 2022-05-26 ENCOUNTER — Emergency Department (HOSPITAL_BASED_OUTPATIENT_CLINIC_OR_DEPARTMENT_OTHER): Payer: 59 | Admitting: Radiology

## 2022-05-26 ENCOUNTER — Emergency Department (HOSPITAL_BASED_OUTPATIENT_CLINIC_OR_DEPARTMENT_OTHER): Payer: 59

## 2022-05-26 ENCOUNTER — Other Ambulatory Visit: Payer: Self-pay

## 2022-05-26 ENCOUNTER — Emergency Department (HOSPITAL_BASED_OUTPATIENT_CLINIC_OR_DEPARTMENT_OTHER)
Admission: EM | Admit: 2022-05-26 | Discharge: 2022-05-26 | Disposition: A | Discharge status: Home / Self Care | Payer: 59 | Attending: Emergency Medicine | Admitting: Emergency Medicine

## 2022-05-26 DIAGNOSIS — M6240 Contracture of muscle, unspecified site: Secondary | ICD-10-CM

## 2022-05-26 DIAGNOSIS — M62838 Other muscle spasm: Secondary | ICD-10-CM | POA: Insufficient documentation

## 2022-05-26 DIAGNOSIS — R0602 Shortness of breath: Secondary | ICD-10-CM | POA: Insufficient documentation

## 2022-05-26 DIAGNOSIS — M791 Myalgia, unspecified site: Secondary | ICD-10-CM | POA: Diagnosis not present

## 2022-05-26 DIAGNOSIS — R55 Syncope and collapse: Secondary | ICD-10-CM | POA: Insufficient documentation

## 2022-05-26 DIAGNOSIS — R42 Dizziness and giddiness: Secondary | ICD-10-CM | POA: Insufficient documentation

## 2022-05-26 LAB — MAGNESIUM: Magnesium: 2.2 mg/dL (ref 1.7–2.4)

## 2022-05-26 LAB — CBC
HCT: 37.2 % (ref 36.0–46.0)
Hemoglobin: 12.6 g/dL (ref 12.0–15.0)
MCH: 31.9 pg (ref 26.0–34.0)
MCHC: 33.9 g/dL (ref 30.0–36.0)
MCV: 94.2 fL (ref 80.0–100.0)
Platelets: 212 10*3/uL (ref 150–400)
RBC: 3.95 MIL/uL (ref 3.87–5.11)
RDW: 13.3 % (ref 11.5–15.5)
WBC: 8.6 10*3/uL (ref 4.0–10.5)
nRBC: 0 % (ref 0.0–0.2)

## 2022-05-26 LAB — BASIC METABOLIC PANEL
Anion gap: 10 (ref 5–15)
BUN: 12 mg/dL (ref 6–20)
CO2: 23 mmol/L (ref 22–32)
Calcium: 9.8 mg/dL (ref 8.9–10.3)
Chloride: 103 mmol/L (ref 98–111)
Creatinine, Ser: 0.91 mg/dL (ref 0.44–1.00)
GFR, Estimated: >60 mL/min (ref >60)
Glucose, Bld: 146 mg/dL — ABNORMAL HIGH (ref 70–99)
Potassium: 4.2 mmol/L (ref 3.5–5.1)
Sodium: 136 mmol/L (ref 135–145)

## 2022-05-26 LAB — URINALYSIS, ROUTINE W REFLEX MICROSCOPIC
Bilirubin Urine: NEGATIVE
Glucose, UA: NEGATIVE mg/dL
Ketones, ur: NEGATIVE mg/dL
Nitrite: NEGATIVE
Specific Gravity, Urine: 1.017 (ref 1.005–1.030)
pH: 6.5 (ref 5.0–8.0)

## 2022-05-26 LAB — CK: Total CK: 98 U/L (ref 38–234)

## 2022-05-26 LAB — PREGNANCY, URINE: Preg Test, Ur: NEGATIVE

## 2022-05-26 MED ORDER — KETOROLAC TROMETHAMINE 60 MG/2ML IM SOLN
60.0000 mg | Freq: Once | INTRAMUSCULAR | Status: AC
Start: 2022-05-26 — End: 2022-05-26
  Administered 2022-05-26: 60 mg via INTRAMUSCULAR
  Filled 2022-05-26 (×2): qty 2

## 2022-05-26 MED ORDER — ALBUTEROL SULFATE HFA 108 (90 BASE) MCG/ACT IN AERS: 2.0000 | INHALATION_SPRAY | RESPIRATORY_TRACT | Status: DC | PRN

## 2022-05-26 MED ORDER — CYCLOBENZAPRINE HCL 5 MG PO TABS
5.0000 mg | ORAL_TABLET | Freq: Once | ORAL | Status: AC
  Administered 2022-05-26: 5 mg via ORAL
  Filled 2022-05-26: qty 1

## 2022-05-26 MED ORDER — CYCLOBENZAPRINE HCL 10 MG PO TABS: 10.0000 mg | ORAL_TABLET | Freq: Two times a day (BID) | ORAL | 0 refills | Status: DC | PRN

## 2022-05-26 MED ORDER — LIDOCAINE 4 % EX PTCH: 1.0000 | MEDICATED_PATCH | CUTANEOUS | 0 refills | Status: AC

## 2022-05-26 MED ORDER — LORAZEPAM 1 MG PO TABS
1.0000 mg | ORAL_TABLET | Freq: Once | ORAL | Status: AC
  Administered 2022-05-26: 0.5 mg via ORAL
  Filled 2022-05-26: qty 1

## 2022-05-26 MED ORDER — IBUPROFEN 800 MG PO TABS: 800.0000 mg | ORAL_TABLET | Freq: Three times a day (TID) | ORAL | 0 refills | Status: DC

## 2022-05-26 MED ORDER — LIDOCAINE 5 % EX PTCH
1.0000 | MEDICATED_PATCH | CUTANEOUS | Status: DC
  Administered 2022-05-26: 1 via TRANSDERMAL
  Filled 2022-05-26 (×2): qty 1

## 2022-05-26 NOTE — ED Notes (Signed)
Patient transported to X-ray 

## 2022-05-26 NOTE — ED Provider Notes (Signed)
[facility] EMERGENCY DEPT Provider Note   CSN: [id number] Arrival date & time: 05/26/22  1756     History  Chief Complaint  Patient presents with   Spasms   Near Syncope   Shortness of Breath    Sabrina Reed is a 41 y.o. female.   Near Syncope Associated symptoms include shortness of breath.  Shortness of Breath    41 year old female with medical history significant for GERD, history of varicella who presents to the emergency department with left upper back and chest muscle spasms and musculoskeletal pain.  The patient cannot think of any injury to her back recently.  She does come back from the beach.  She denies any significant heavy lifting.  Her last tetanus was up-to-date as of 2016.  She states that she has had muscle spasms along her left upper back and along her left chest wall.  It hurts to take a deep breath due to the spasming and pain.  She denies any chest pain or pressure.  She felt near syncopal and subsequently had a vasovagal episode with lightheadedness, diaphoresis and syncope.  She took 2 Robaxin earlier today with no relief which had been prescribed due to a similar prior episode. She denies midline back pain or trauma. No spider bite or inciting event that she can think of.  Home Medications Prior to Admission medications   Medication Sig Start Date End Date Taking? Authorizing Provider  ibuprofen (ADVIL,MOTRIN) 600 MG tablet Take 1 tablet (600 mg total) by mouth every 6 (six) hours as needed for mild pain. 03/01/15   Julio Sicks, NP  oxyCODONE-acetaminophen (PERCOCET/ROXICET) 5-325 MG per tablet Take 1 tablet by mouth every 4 (four) hours as needed (for pain scale 4-7). 03/01/15   Julio Sicks, NP  Prenatal Vit-Fe Fumarate-FA (PRENATAL MULTIVITAMIN) TABS tablet Take 1 tablet by mouth daily at 12 noon.    [provider]      Allergies    Eggs or egg-derived products    Review of Systems   Review of Systems  Respiratory:   Positive for chest tightness and shortness of breath.   Cardiovascular:  Positive for near-syncope.  Neurological:  Positive for syncope.    Physical Exam Updated Vital Signs BP (!) 123/94   Pulse 64   Temp 97.9 F (36.6 C) (Oral)   Resp 20   Ht 5\' 2"  (1.575 m)   Wt 56.7 kg   LMP 05/05/2022 (Approximate)   SpO2 100%   BMI 22.86 kg/m  Physical Exam Vitals and nursing note reviewed.  Constitutional:      General: She is not in acute distress. HENT:     Head: Normocephalic and atraumatic.  Eyes:     Conjunctiva/sclera: Conjunctivae normal.     Pupils: Pupils are equal, round, and reactive to light.  Cardiovascular:     Rate and Rhythm: Normal rate and regular rhythm.  Pulmonary:     Effort: Pulmonary effort is normal. No respiratory distress.  Chest:     Comments: Mild left sided chest wall TTP. No rash. Abdominal:     General: There is no distension.     Tenderness: There is no guarding.  Musculoskeletal:        General: No deformity or signs of injury.     Cervical back: Neck supple.     Comments: Mild left upper back and left chest wall tenderness to palpation. No midline tenderness of the spine.  Skin:    Findings: No lesion or rash.  Neurological:     General: No focal deficit present.     Mental Status: She is alert. Mental status is at baseline.     ED Results / Procedures / Treatments   Labs (all labs ordered are listed, but only abnormal results are displayed) Labs Reviewed  CBC  BASIC METABOLIC PANEL  URINALYSIS, ROUTINE W REFLEX MICROSCOPIC  PREGNANCY, URINE  CBG MONITORING, ED    EKG EKG Interpretation  Date/Time:  Sunday May 26 2022 18:13:10 EDT Ventricular Rate:  60 PR Interval:  190 QRS Duration: 64 QT Interval:  426 QTC Calculation: 426 R Axis:   73 Text Interpretation: Normal sinus rhythm Normal ECG No previous ECGs available Confirmed by Ernie Avena ([id number]) on 05/26/2022 6:44:39 PM  Radiology No results  found.  Procedures Procedures    Medications Ordered in ED Medications - No data to display  ED Course/ Medical Decision Making/ A&P                           Medical Decision Making Amount and/or Complexity of Data Reviewed Labs: ordered. Radiology: ordered.  Risk Prescription drug management.    41 year old female with medical history significant for GERD, history of varicella who presents to the emergency department with left upper back and chest muscle spasms and musculoskeletal pain.  The patient cannot think of any injury to her back recently.  She does come back from the beach.  She denies any significant heavy lifting.  Her last tetanus was up-to-date as of 2016.  She states that she has had muscle spasms along her left upper back and along her left chest wall.  It hurts to take a deep breath due to the spasming and pain.  She denies any chest pain or pressure.  She felt near syncopal and subsequently had a vasovagal episode with lightheadedness, diaphoresis and syncope.  She took 2 Robaxin earlier today with no relief which had been prescribed due to a similar prior episode. She denies midline back pain or trauma. No spider bite or inciting event that she can think of.    {Document critical care time when appropriate:1} {Document review of labs and clinical decision tools ie heart score, Chads2Vasc2 etc:1}  {Document your independent review of radiology images, and any outside records:1} {Document your discussion with family members, caretakers, and with consultants:1} {Document social determinants of health affecting pt's care:1} {Document your decision making why or why not admission, treatments were needed:1} Final Clinical Impression(s) / ED Diagnoses Final diagnoses:  None    Rx / DC Orders ED Discharge Orders     None

## 2022-05-26 NOTE — ED Triage Notes (Signed)
Pt reports muscle spasms, SHOB, and near syncope. Pt took robaxin with no relief

## 2022-05-26 NOTE — ED Notes (Signed)
Pt states near syncopal episode this afternoon and muscle spasms esp on left side/flank area.  Did take robaxin without relief.   At times hurts to take a deep breath.   VSS, alert and oriented, husband at bedside.

## 2022-05-26 NOTE — ED Notes (Signed)
Discharge paperwork given and verbally understood. 

## 2022-08-14 ENCOUNTER — Encounter (HOSPITAL_BASED_OUTPATIENT_CLINIC_OR_DEPARTMENT_OTHER): Payer: Self-pay | Admitting: Emergency Medicine

## 2022-08-14 ENCOUNTER — Emergency Department (HOSPITAL_BASED_OUTPATIENT_CLINIC_OR_DEPARTMENT_OTHER): Payer: 59

## 2022-08-14 ENCOUNTER — Other Ambulatory Visit: Payer: Self-pay

## 2022-08-14 ENCOUNTER — Observation Stay (HOSPITAL_BASED_OUTPATIENT_CLINIC_OR_DEPARTMENT_OTHER)
Admission: EM | Admit: 2022-08-14 | Discharge: 2022-08-15 | Disposition: A | Discharge status: Home / Self Care | Payer: 59 | Attending: Family Medicine | Admitting: Family Medicine

## 2022-08-14 ENCOUNTER — Encounter (HOSPITAL_COMMUNITY): Payer: Self-pay

## 2022-08-14 DIAGNOSIS — I2699 Other pulmonary embolism without acute cor pulmonale: Secondary | ICD-10-CM

## 2022-08-14 DIAGNOSIS — Z7901 Long term (current) use of anticoagulants: Secondary | ICD-10-CM | POA: Insufficient documentation

## 2022-08-14 DIAGNOSIS — I2609 Other pulmonary embolism with acute cor pulmonale: Secondary | ICD-10-CM | POA: Diagnosis not present

## 2022-08-14 DIAGNOSIS — Z79899 Other long term (current) drug therapy: Secondary | ICD-10-CM | POA: Diagnosis not present

## 2022-08-14 DIAGNOSIS — R0602 Shortness of breath: Secondary | ICD-10-CM | POA: Diagnosis present

## 2022-08-14 LAB — CBC
HCT: 35 % — ABNORMAL LOW (ref 36.0–46.0)
Hemoglobin: 11.6 g/dL — ABNORMAL LOW (ref 12.0–15.0)
MCH: 30.4 pg (ref 26.0–34.0)
MCHC: 33.1 g/dL (ref 30.0–36.0)
MCV: 91.6 fL (ref 80.0–100.0)
Platelets: 231 10*3/uL (ref 150–400)
RBC: 3.82 MIL/uL — ABNORMAL LOW (ref 3.87–5.11)
RDW: 12.3 % (ref 11.5–15.5)
WBC: 7.2 10*3/uL (ref 4.0–10.5)
nRBC: 0 % (ref 0.0–0.2)

## 2022-08-14 LAB — COMPREHENSIVE METABOLIC PANEL
ALT: 11 U/L (ref 0–44)
AST: 14 U/L — ABNORMAL LOW (ref 15–41)
Albumin: 4 g/dL (ref 3.5–5.0)
Alkaline Phosphatase: 50 U/L (ref 38–126)
Anion gap: 11 (ref 5–15)
BUN: 12 mg/dL (ref 6–20)
CO2: 23 mmol/L (ref 22–32)
Calcium: 9.9 mg/dL (ref 8.9–10.3)
Chloride: 102 mmol/L (ref 98–111)
Creatinine, Ser: 0.82 mg/dL (ref 0.44–1.00)
GFR, Estimated: >60 mL/min (ref >60)
Glucose, Bld: 84 mg/dL (ref 70–99)
Potassium: 4.3 mmol/L (ref 3.5–5.1)
Sodium: 136 mmol/L (ref 135–145)
Total Bilirubin: 0.5 mg/dL (ref 0.3–1.2)
Total Protein: 7.7 g/dL (ref 6.5–8.1)

## 2022-08-14 LAB — BRAIN NATRIURETIC PEPTIDE: B Natriuretic Peptide: 92.2 pg/mL (ref 0.0–100.0)

## 2022-08-14 LAB — TROPONIN I (HIGH SENSITIVITY): Troponin I (High Sensitivity): <2 ng/L (ref <18)

## 2022-08-14 MED ORDER — MORPHINE SULFATE (PF) 4 MG/ML IV SOLN
4.0000 mg | Freq: Once | INTRAVENOUS | Status: AC
  Administered 2022-08-14: 4 mg via INTRAVENOUS
  Filled 2022-08-14: qty 1

## 2022-08-14 MED ORDER — HEPARIN (PORCINE) 25000 UT/250ML-% IV SOLN
1000.0000 [IU]/h | INTRAVENOUS | Status: DC
  Administered 2022-08-14: 1000 [IU]/h via INTRAVENOUS
  Filled 2022-08-14: qty 250

## 2022-08-14 NOTE — Progress Notes (Signed)
ANTICOAGULATION CONSULT NOTE - Initial Consult  Pharmacy Consult for heparin Indication: pulmonary embolus  Allergies  Allergen Reactions   Eggs Or Egg-Derived Products Other (See Comments)    GI upset    Patient Measurements: Height: 5\' 2"  (157.5 cm) Weight: 58.1 kg (128 lb) IBW/kg (Calculated) : 50.1 Heparin Dosing Weight: 58.1 kg  Vital Signs: Temp: 98.4 F (36.9 C) (11/08 2024) Temp Source: Oral (11/08 2024) BP: 101/63 (11/08 2045) Pulse Rate: 67 (11/08 2045)  Labs: Recent Labs    08/14/22 2017  HGB 11.6*  HCT 35.0*  PLT 231  CREATININE 0.82  TROPONINIHS <2    Estimated Creatinine Clearance: 72.1 mL/min (by C-G formula based on SCr of 0.82 mg/dL).   Medical History: Past Medical History:  Diagnosis Date   Family history of breast cancer    Family history of kidney cancer    GERD (gastroesophageal reflux disease)    with pregnancy   H/O varicella    Newborn product of IVF pregnancy    Assessment: 41 year old female presenting with worsening shortness of breath and pain in the left flank area. History of PE diagnosed on 08/12/22 by outpatient CT scan and started on apixaban. Last dose of apixaban on 11/8 at 1730. Pharmacy has been consulted to dose heparin.  Hgb 11.6, platelets are WNL. Given recent apixaban use, will not give bolus. Plan to monitor heparin and aPTT levels until correlating.   Goal of Therapy:  Heparin level 0.3-0.7 units/ml aPTT 66-102 seconds Monitor platelets by anticoagulation protocol: Yes   Plan:  Start heparin infusion at 1000 units/hour Check 6-hour heparin and aPTT level Monitor daily heparin and aPTT levels until correlating Monitor daily CBC and signs/symptoms of bleeding  13/8, PharmD, [facility] PGY1 Pharmacy Resident 08/14/2022 10:02 PM

## 2022-08-14 NOTE — ED Notes (Signed)
ED TO INPATIENT HANDOFF REPORT  ED Nurse Name and Phone #: Fenton Malling RN [phone]  S Name/Age/Gender Sabrina Reed 41 y.o. female Room/Bed: [phone]  Code Status   Code Status: Prior  Home/SNF/Other Home Patient oriented to: self, place, time, and situation Is this baseline? Yes   Triage Complete: Triage complete  Chief Complaint Acute pulmonary embolism with acute cor pulmonale (HCC) [I26.09]  Triage Note Pt arrives pov, slow gait, co shob and LT side back pain acutely this am. Pt recent dx with bilateral PE by [facility]   Allergies Allergies  Allergen Reactions   Eggs Or Egg-Derived Products Other (See Comments)    GI upset    Level of Care/Admitting Diagnosis ED Disposition     ED Disposition  Admit   Condition  --   Comment  Hospital Area: [facility] [[id number]]  Level of Care: Stepdown [14]  Admit to SDU based on following criteria: Other see comments  Comments: PE with RV strain  May admit patient to [facility] or [facility] if equivalent level of care is available:: Yes  Interfacility transfer: Yes  Covid Evaluation: Asymptomatic - no recent exposure (last 10 days) testing not required  Diagnosis: Acute pulmonary embolism with acute cor pulmonale [facility]) [[id number]]  Admitting Physician: Hannah Beat [[id number]]  Attending Physician: Hannah Beat [phone]  Certification:: I certify this patient will need inpatient services for at least 2 midnights  Estimated Length of Stay: 3          B Medical/Surgery History Past Medical History:  Diagnosis Date   Family history of breast cancer    Family history of kidney cancer    GERD (gastroesophageal reflux disease)    with pregnancy   H/O varicella    Newborn product of IVF pregnancy    Past Surgical History:  Procedure Laterality Date   CESAREAN SECTION  10/07/2012   Procedure: CESAREAN SECTION;  Surgeon: Meriel Pica, MD;  Location: [facility];  Service: Obstetrics;   Laterality: N/A;   CESAREAN SECTION N/A 02/27/2015   Procedure: CESAREAN SECTION;  Surgeon: Candice Camp, MD;  Location: [facility];  Service: Obstetrics;  Laterality: N/A;  Repeat edc 5/29   REFRACTIVE SURGERY     WISDOM TOOTH EXTRACTION       A IV Location/Drains/Wounds Patient Lines/Drains/Airways Status     Active Line/Drains/Airways     Name Placement date Placement time Site Days   Peripheral IV 08/14/22 20 G Right Antecubital 08/14/22  2025  Antecubital  less than 1   Peripheral IV 08/14/22 20 G 1" Right Forearm 08/14/22  2216  Forearm  less than 1            Intake/Output Last 24 hours No intake or output data in the 24 hours ending 08/14/22 2354  Labs/Imaging Results for orders placed or performed during the hospital encounter of 08/14/22 (from the past 48 hour(s))  CBC     Status: Abnormal   Collection Time: 08/14/22  8:17 PM  Result Value Ref Range   WBC 7.2 4.0 - 10.5 K/uL   RBC 3.82 (L) 3.87 - 5.11 MIL/uL   Hemoglobin 11.6 (L) 12.0 - 15.0 g/dL   HCT 34.2 (L) 87.6 - 81.1 %   MCV 91.6 80.0 - 100.0 fL   MCH 30.4 26.0 - 34.0 pg   MCHC 33.1 30.0 - 36.0 g/dL   RDW 57.2 62.0 - 35.5 %   Platelets 231 150 - 400 K/uL   nRBC  0.0 0.0 - 0.2 %    Comment: Performed at [facility], [address] [place], [place] [address]  Comprehensive metabolic panel     Status: Abnormal   Collection Time: 08/14/22  8:17 PM  Result Value Ref Range   Sodium 136 135 - 145 mmol/L   Potassium 4.3 3.5 - 5.1 mmol/L   Chloride 102 98 - 111 mmol/L   CO2 23 22 - 32 mmol/L   Glucose, Bld 84 70 - 99 mg/dL    Comment: Glucose reference range applies only to samples taken after fasting for at least 8 hours.   BUN 12 6 - 20 mg/dL   Creatinine, Ser 3.08 0.44 - 1.00 mg/dL   Calcium 9.9 8.9 - 65.7 mg/dL   Total Protein 7.7 6.5 - 8.1 g/dL   Albumin 4.0 3.5 - 5.0 g/dL   AST 14 (L) 15 - 41 U/L   ALT 11 0 - 44 U/L   Alkaline Phosphatase 50 38 - 126 U/L   Total Bilirubin 0.5 0.3  - 1.2 mg/dL   GFR, Estimated >84 >69 mL/min    Comment: (NOTE) Calculated using the CKD-EPI Creatinine Equation (2021)    Anion gap 11 5 - 15    Comment: Performed at [facility], [address] [place], [place] [address]  Troponin I (High Sensitivity)     Status: None   Collection Time: 08/14/22  8:17 PM  Result Value Ref Range   Troponin I (High Sensitivity) <2 <18 ng/L    Comment: (NOTE) Elevated high sensitivity troponin I (hsTnI) values and significant  changes across serial measurements may suggest ACS but many other  chronic and acute conditions are known to elevate hsTnI results.  Refer to the "Links" section for chest pain algorithms and additional  guidance. Performed at [facility], [address] [place], [place] [address]   Brain natriuretic peptide     Status: None   Collection Time: 08/14/22  8:17 PM  Result Value Ref Range   B Natriuretic Peptide 92.2 0.0 - 100.0 pg/mL    Comment: Performed at [facility], [address] [place], [place] [address]   US Venous Img Lower Bilateral (DVT)  Result Date: 08/14/2022 CLINICAL DATA:  Shortness of breath, recent PE EXAM: BILATERAL LOWER EXTREMITY VENOUS DOPPLER ULTRASOUND TECHNIQUE: Gray-scale sonography with compression, as well as color and duplex ultrasound, were performed to evaluate the deep venous system(s) from the level of the common femoral vein through the popliteal and proximal calf veins. COMPARISON:  None Available. FINDINGS: VENOUS Normal compressibility of the common femoral, superficial femoral, and popliteal veins, as well as the visualized calf veins. Visualized portions of profunda femoral vein and great saphenous vein unremarkable. No filling defects to suggest DVT on grayscale or color Doppler imaging. Doppler waveforms show normal direction of venous flow, normal respiratory plasticity and response to augmentation. OTHER None. Limitations: none  IMPRESSION: Negative. Electronically Signed   By: Charline Bills M.D.   On: 08/14/2022 23:18    Pending Labs Unresulted Labs (From admission, onward)     Start     Ordered   08/15/22 0500  CBC  Daily at 5am,   R      08/14/22 2212   08/15/22 0400  Heparin level (unfractionated)  Once-Timed,   URGENT        08/14/22 2212   08/15/22 0400  APTT  Once-Timed,   STAT        08/14/22 2212  08/14/22 2306  hCG, serum, qualitative  Once,   URGENT        08/14/22 2305            Vitals/Pain Today's Vitals   08/14/22 2030 08/14/22 2045 08/14/22 2217 08/14/22 2217  BP: 104/67 101/63 102/62   Pulse: (!) 59 67 (!) 56   Resp: 17 19 16    Temp:   98.5 F (36.9 C)   TempSrc:   Oral   SpO2: 100% 98% 98%   Weight:      Height:      PainSc:    5     Isolation Precautions No active isolations  Medications Medications  heparin ADULT infusion 100 units/mL (25000 units/213mL) (1,000 Units/hr Intravenous New Bag/Given 08/14/22 2220)  morphine (PF) 4 MG/ML injection 4 mg (4 mg Intravenous Given 08/14/22 2051)    Mobility walks Low fall risk   Focused Assessments Cardiac Assessment Handoff:  Cardiac Rhythm: Normal sinus rhythm Lab Results  Component Value Date   CKTOTAL 98 05/26/2022   No results found for: "DDIMER" Does the Patient currently have chest pain? No      R Recommendations: See Admitting Provider Note  Report given to:   Additional Notes: Pt A&Ox4. Ambulatory with slow but steady gait. SOB increased with exertion. Continent. Heparin infusing at 1000 U/hr.

## 2022-08-14 NOTE — ED Provider Notes (Signed)
MEDCENTER [facility] EMERGENCY DEPT Provider Note   CSN: [id number] Arrival date & time: 08/14/22  1822     History {Add pertinent medical, surgical, social history, OB history to HPI:1} Chief Complaint  Patient presents with   Shortness of Breath    Sabrina Reed is a 41 y.o. female.   Shortness of Breath    Patient has been having trouble with pain in the left flank area since August.  She has had episodes of sharp intense pain.  She has felt short of breath.  She has had near syncopal episodes.  Patient was seen in the emergency room at that time.  She was treated for muscle spasms.  Patient has had recurrent episodes.  In October of this year she ended up having a CT scan for possible kidney stone.  She had a follow-up visit with her primary doctor on November 6.  I do not have the results of that record.  Patient had an outpatient CT scan and it showed a pulmonary embolism.  Patient was started on Eliquis by her primary care doctor.  However because of her increasing pain and discomfort she was instructed to come to the ED.  Patient has had recent plane travel.  Home Medications Prior to Admission medications   Medication Sig Start Date End Date Taking? Authorizing Provider  cyclobenzaprine (FLEXERIL) 10 MG tablet Take 1 tablet (10 mg total) by mouth 2 (two) times daily as needed for muscle spasms. 05/26/22   Ernie Avena, MD  ibuprofen (ADVIL) 800 MG tablet Take 1 tablet (800 mg total) by mouth 3 (three) times daily. 05/26/22   Ernie Avena, MD  oxyCODONE-acetaminophen (PERCOCET/ROXICET) 5-325 MG per tablet Take 1 tablet by mouth every 4 (four) hours as needed (for pain scale 4-7). 03/01/15   Julio Sicks, NP  Prenatal Vit-Fe Fumarate-FA (PRENATAL MULTIVITAMIN) TABS tablet Take 1 tablet by mouth daily at 12 noon.    [provider]      Allergies    Eggs or egg-derived products    Review of Systems   Review of Systems  Respiratory:  Positive for shortness  of breath.     Physical Exam Updated Vital Signs BP 101/63   Pulse 67   Temp 98.4 F (36.9 C) (Oral)   Resp 19   Ht 1.575 m (5\' 2" )   Wt 58.1 kg   LMP 07/24/2022   SpO2 98%   BMI 23.41 kg/m  Physical Exam Vitals and nursing note reviewed.  Constitutional:      Appearance: She is well-developed. She is not diaphoretic.  HENT:     Head: Normocephalic and atraumatic.     Right Ear: External ear normal.     Left Ear: External ear normal.  Eyes:     General: No scleral icterus.       Right eye: No discharge.        Left eye: No discharge.     Conjunctiva/sclera: Conjunctivae normal.  Neck:     Trachea: No tracheal deviation.  Cardiovascular:     Rate and Rhythm: Normal rate and regular rhythm.  Pulmonary:     Effort: Pulmonary effort is normal. No respiratory distress.     Breath sounds: Normal breath sounds. No stridor. No wheezing or rales.  Abdominal:     General: Bowel sounds are normal. There is no distension.     Palpations: Abdomen is soft.     Tenderness: There is no abdominal tenderness. There is no guarding or rebound.  Musculoskeletal:        General: No tenderness or deformity.     Cervical back: Neck supple.     Right lower leg: No edema.     Left lower leg: No edema.  Skin:    General: Skin is warm and dry.     Findings: No rash.  Neurological:     General: No focal deficit present.     Mental Status: She is alert.     Cranial Nerves: No cranial nerve deficit (no facial droop, extraocular movements intact, no slurred speech).     Sensory: No sensory deficit.     Motor: No abnormal muscle tone or seizure activity.     Coordination: Coordination normal.  Psychiatric:        Mood and Affect: Mood normal.     ED Results / Procedures / Treatments   Labs (all labs ordered are listed, but only abnormal results are displayed) Labs Reviewed  CBC - Abnormal; Notable for the following components:      Result Value   RBC 3.82 (*)    Hemoglobin 11.6 (*)     HCT 35.0 (*)    All other components within normal limits  COMPREHENSIVE METABOLIC PANEL - Abnormal; Notable for the following components:   AST 14 (*)    All other components within normal limits  TROPONIN I (HIGH SENSITIVITY)    EKG EKG Interpretation  Date/Time:  Wednesday August 14 2022 18:41:25 EST Ventricular Rate:  64 PR Interval:  178 QRS Duration: 84 QT Interval:  396 QTC Calculation: 408 R Axis:   74 Text Interpretation: Normal sinus rhythm Normal ECG When compared with ECG of 26-May-2022 18:13, No significant change was found No significant change since last tracing Confirmed by Linwood Dibbles [phone]) on 08/14/2022 7:45:27 PM  Radiology No results found. Results reviewed from [facility]. IMPRESSION: There are filling defects seen within the left upper and lower lobe segmental arteries as well as the distal left main pulmonary artery and right upper and lower segmental arteries consistent with pulmonary emboli. No saddle embolism is seen. There is prominence of the right ventricle suggesting right heart strain.  There is a groundglass opacity within the left lung base favored to represent an evolving infarct or an inflammatory/infectious etiology. This will need interval follow-up within 2-3 months could less likely represent a developing nodule.  Procedures Procedures  {Document cardiac monitor, telemetry assessment procedure when appropriate:1}  Medications Ordered in ED Medications  morphine (PF) 4 MG/ML injection 4 mg (4 mg Intravenous Given 08/14/22 2051)    ED Course/ Medical Decision Making/ A&P Clinical Course as of 08/14/22 2157  Wed Aug 14, 2022  2053 Patient had an outpatient CT scan on November 6.  It showed a pulmonary embolism.  Those records were reviewed. [JK]  2058 CBC(!) Mild anemia noted [JK]  2131 Comprehensive metabolic panel(!) Metabolic panel without significant abnormalities [JK]  2131 Troponin I (High Sensitivity) Troponin negative [JK]     Clinical Course User Index [JK] Linwood Dibbles, MD                           Medical Decision Making Problems Addressed: Acute pulmonary embolism, unspecified pulmonary embolism type, unspecified whether acute cor pulmonale present [facility]): acute illness or injury that poses a threat to life or bodily functions  Amount and/or Complexity of Data Reviewed Labs: ordered. Decision-making details documented in ED Course.  Risk Prescription drug management. Drug therapy requiring intensive  monitoring for toxicity.   Patient presented to the ED for evaluation of persistent symptoms associated with recent pulmonary embolism diagnosis.  Hemodynamically stable.  No signs of cardiac ischemia.  Her outpatient CT scan suggested right heart strain but she has a low risk PESI (70 , sbp <100) score and normal troponins.  I do think patient would benefit from an echocardiogram.  With her worsening symptoms have ordered IV anticoagulation.  Plan on admission to the hospital for further treatment  {Document critical care time when appropriate:1} {Document review of labs and clinical decision tools ie heart score, Chads2Vasc2 etc:1}  {Document your independent review of radiology images, and any outside records:1} {Document your discussion with family members, caretakers, and with consultants:1} {Document social determinants of health affecting pt's care:1} {Document your decision making why or why not admission, treatments were needed:1} Final Clinical Impression(s) / ED Diagnoses Final diagnoses:  Acute pulmonary embolism, unspecified pulmonary embolism type, unspecified whether acute cor pulmonale present (HCC)    Rx / DC Orders ED Discharge Orders     None

## 2022-08-14 NOTE — ED Triage Notes (Signed)
Pt arrives pov, slow gait, co shob and LT side back pain acutely this am. Pt recent dx with bilateral PE by [facility]

## 2022-08-14 NOTE — Progress Notes (Addendum)
Transfer note: Pending bed availability.  Patient's name: Sabrina Reed, Sabrina Reed  MRN: [id number]  Referring physician: Iantha Fallen, MD  This is a 41 year old female with recent history of left flank pain since August with episode of sharp intense pain and recent acute dyspnea and presyncopal episodes she was previously treated for muscle spasms and given recurrent episodes had a CT renal stone that was unremarkable.  She had a follow-up with her PCP on 11/6 and he ordered an outpatient chest CTA that did show multiple pulmonary emboli involving the left upper and lower lobe segmental arteries as well as the distal left main pulmonary artery and right upper and lower segmental arteries with no saddle emboli however with prominence of the right ventricle suggesting right heart strain.  There was a groundglass opacity at the left lung base that favored to represent evolving infarct or an inflammatory/infectious etiology and will need interval follow-up within 2 to 3 months.  The patient had brief hypotension of with a BP of 96/51 and that came up to 102/62 with a heart rate of 56 and otherwise stable vital signs.  Venous Doppler of both lower extremities was ordered.EKG showed normal sinus rhythm with a rate of 64 with no acute changes.  Portable chest x-ray showed low lung volumes with bibasilar atelectasis.  CBC showed mild anemia and CMP was unremarkable.  High sensitive troponin I was less than 2.  Pregnancy test is pending.  She had a recent travel so it could be provoked.  Total PESI score is 70, making her class II, low risk with 1.7-3.5% 30-day mortality.  The patient was accepted to a stepdown unit bed.  PCCM notified and will consult in stepdown and they can coordinate vascular surgery evaluation.  She will need an echo and follow-up on her vascular ultrasound.  No current beds are available at [facility] or [facility] hospitals.  A bed was ordered at [facility].  The ER physician will continue to  care for the patient till she is transferred.

## 2022-08-15 ENCOUNTER — Inpatient Hospital Stay (HOSPITAL_BASED_OUTPATIENT_CLINIC_OR_DEPARTMENT_OTHER): Payer: 59

## 2022-08-15 ENCOUNTER — Telehealth: Payer: Self-pay | Admitting: Student

## 2022-08-15 ENCOUNTER — Inpatient Hospital Stay (HOSPITAL_COMMUNITY): Payer: 59

## 2022-08-15 DIAGNOSIS — I2699 Other pulmonary embolism without acute cor pulmonale: Secondary | ICD-10-CM | POA: Diagnosis not present

## 2022-08-15 DIAGNOSIS — I2609 Other pulmonary embolism with acute cor pulmonale: Principal | ICD-10-CM

## 2022-08-15 DIAGNOSIS — R0602 Shortness of breath: Secondary | ICD-10-CM | POA: Diagnosis present

## 2022-08-15 DIAGNOSIS — Z7901 Long term (current) use of anticoagulants: Secondary | ICD-10-CM | POA: Diagnosis not present

## 2022-08-15 DIAGNOSIS — Z79899 Other long term (current) drug therapy: Secondary | ICD-10-CM | POA: Diagnosis not present

## 2022-08-15 DIAGNOSIS — R911 Solitary pulmonary nodule: Secondary | ICD-10-CM

## 2022-08-15 LAB — HIV ANTIBODY (ROUTINE TESTING W REFLEX): HIV Screen 4th Generation wRfx: NONREACTIVE

## 2022-08-15 LAB — HCG, SERUM, QUALITATIVE: Preg, Serum: NEGATIVE

## 2022-08-15 LAB — CBC
HCT: 33.3 % — ABNORMAL LOW (ref 36.0–46.0)
Hemoglobin: 11.1 g/dL — ABNORMAL LOW (ref 12.0–15.0)
MCH: 30.7 pg (ref 26.0–34.0)
MCHC: 33.3 g/dL (ref 30.0–36.0)
MCV: 92.2 fL (ref 80.0–100.0)
Platelets: 223 10*3/uL (ref 150–400)
RBC: 3.61 MIL/uL — ABNORMAL LOW (ref 3.87–5.11)
RDW: 12.2 % (ref 11.5–15.5)
WBC: 6.6 10*3/uL (ref 4.0–10.5)
nRBC: 0 % (ref 0.0–0.2)

## 2022-08-15 LAB — ECHOCARDIOGRAM COMPLETE
AR max vel: 2.62 cm2
AV Area VTI: 2.64 cm2
AV Area mean vel: 2.65 cm2
AV Mean grad: 4 mmHg
AV Peak grad: 8.8 mmHg
Ao pk vel: 1.48 m/s
Area-P 1/2: 2.8 cm2
Calc EF: 65.5 %
Height: 62 in
MV M vel: 1.83 m/s
MV Peak grad: 13.3 mmHg
S' Lateral: 2.4 cm
Single Plane A2C EF: 60.9 %
Single Plane A4C EF: 66.9 %
Weight: 2059.98 oz

## 2022-08-15 LAB — HEPARIN LEVEL (UNFRACTIONATED): Heparin Unfractionated: >1.1 IU/mL — ABNORMAL HIGH (ref 0.30–0.70)

## 2022-08-15 LAB — MRSA NEXT GEN BY PCR, NASAL: MRSA by PCR Next Gen: NOT DETECTED

## 2022-08-15 LAB — APTT: aPTT: 59 seconds — ABNORMAL HIGH (ref 24–36)

## 2022-08-15 LAB — TROPONIN I (HIGH SENSITIVITY): Troponin I (High Sensitivity): <2 ng/L (ref <18)

## 2022-08-15 MED ORDER — APIXABAN 5 MG PO TABS: 5.0000 mg | ORAL_TABLET | Freq: Two times a day (BID) | ORAL | Status: DC

## 2022-08-15 MED ORDER — CHLORHEXIDINE GLUCONATE CLOTH 2 % EX PADS: 6.0000 | MEDICATED_PAD | Freq: Every day | CUTANEOUS | Status: DC

## 2022-08-15 MED ORDER — ORAL CARE MOUTH RINSE: 15.0000 mL | OROMUCOSAL | Status: DC | PRN

## 2022-08-15 MED ORDER — MORPHINE SULFATE (PF) 2 MG/ML IV SOLN: 2.0000 mg | INTRAVENOUS | Status: DC | PRN

## 2022-08-15 MED ORDER — ACETAMINOPHEN 650 MG RE SUPP: 650.0000 mg | Freq: Four times a day (QID) | RECTAL | Status: DC | PRN

## 2022-08-15 MED ORDER — SENNA 8.6 MG PO TABS: 1.0000 | ORAL_TABLET | Freq: Every day | ORAL | Status: DC | PRN

## 2022-08-15 MED ORDER — APIXABAN 5 MG PO TABS: ORAL_TABLET | ORAL | 6 refills | Status: AC

## 2022-08-15 MED ORDER — HEPARIN (PORCINE) 25000 UT/250ML-% IV SOLN: 1100.0000 [IU]/h | INTRAVENOUS | Status: AC

## 2022-08-15 MED ORDER — HYDROCODONE-ACETAMINOPHEN 5-325 MG PO TABS
1.0000 | ORAL_TABLET | ORAL | Status: DC | PRN
  Administered 2022-08-15: 1 via ORAL
  Filled 2022-08-15: qty 1

## 2022-08-15 MED ORDER — CHLORHEXIDINE GLUCONATE CLOTH 2 % EX PADS
6.0000 | MEDICATED_PAD | Freq: Every day | CUTANEOUS | Status: DC
  Administered 2022-08-15: 6 via TOPICAL

## 2022-08-15 MED ORDER — APIXABAN 5 MG PO TABS
10.0000 mg | ORAL_TABLET | Freq: Two times a day (BID) | ORAL | Status: DC
  Administered 2022-08-15: 10 mg via ORAL
  Filled 2022-08-15: qty 2

## 2022-08-15 MED ORDER — ONDANSETRON HCL 4 MG PO TABS: 4.0000 mg | ORAL_TABLET | Freq: Four times a day (QID) | ORAL | Status: DC | PRN

## 2022-08-15 MED ORDER — ACETAMINOPHEN 325 MG PO TABS: 650.0000 mg | ORAL_TABLET | Freq: Four times a day (QID) | ORAL | Status: DC | PRN

## 2022-08-15 MED ORDER — DOCUSATE SODIUM 100 MG PO CAPS: 100.0000 mg | ORAL_CAPSULE | Freq: Every day | ORAL | Status: DC

## 2022-08-15 MED ORDER — ONDANSETRON HCL 4 MG/2ML IJ SOLN: 4.0000 mg | Freq: Four times a day (QID) | INTRAMUSCULAR | Status: DC | PRN

## 2022-08-15 MED ORDER — HEPARIN BOLUS VIA INFUSION
1000.0000 [IU] | Freq: Once | INTRAVENOUS | Status: AC
  Administered 2022-08-15: 1000 [IU] via INTRAVENOUS
  Filled 2022-08-15: qty 1000

## 2022-08-15 NOTE — Plan of Care (Signed)

## 2022-08-15 NOTE — Assessment & Plan Note (Addendum)
Acute PEs with pulmonary infarct causing R sided flank / back pain.  In retrospect this was also present on CT at end of Oct. Clinically opacity is most c/w pulmonary infarct as well given the pleuritic pain, lack of SIRS, etc. Heparin GTT for the moment No DVT in legs No obvious malignancy on CTA chest nor recent CT AP May need follow up CT chest for the LLL opacity as per rads report. ? Provoked by Nuvaring hormonal birth control possibly ? Pt has already [facility] this at recommendation of PCP. Getting 2d echo. IV morphine PRN pain.

## 2022-08-15 NOTE — Discharge Instructions (Signed)
Information on my medicine - ELIQUIS (apixaban)  This medication education was reviewed with me or my healthcare representative as part of my discharge preparation.    Why was Eliquis prescribed for you? Eliquis was prescribed to treat blood clots that may have been found in the veins of your legs (deep vein thrombosis) or in your lungs (pulmonary embolism) and to reduce the risk of them occurring again.  What do You need to know about Eliquis ? The starting dose is 10 mg (two 5 mg tablets) taken TWICE daily for the FIRST SEVEN (7) DAYS, then on (enter date)  08/22/22  the dose is reduced to ONE 5 mg tablet taken TWICE daily.  Eliquis may be taken with or without food.   Try to take the dose about the same time in the morning and in the evening. If you have difficulty swallowing the tablet whole please discuss with your pharmacist how to take the medication safely.  Take Eliquis exactly as prescribed and DO NOT stop taking Eliquis without talking to the doctor who prescribed the medication.  Stopping may increase your risk of developing a new blood clot.  Refill your prescription before you run out.  After discharge, you should have regular check-up appointments with your healthcare provider that is prescribing your Eliquis.    What do you do if you miss a dose? If a dose of ELIQUIS is not taken at the scheduled time, take it as soon as possible on the same day and twice-daily administration should be resumed. The dose should not be doubled to make up for a missed dose.  Important Safety Information A possible side effect of Eliquis is bleeding. You should call your healthcare provider right away if you experience any of the following: Bleeding from an injury or your nose that does not stop. Unusual colored urine (red or dark brown) or unusual colored stools (red or black). Unusual bruising for unknown reasons. A serious fall or if you hit your head (even if there is no  bleeding).  Some medicines may interact with Eliquis and might increase your risk of bleeding or clotting while on Eliquis. To help avoid this, consult your healthcare provider or pharmacist prior to using any new prescription or non-prescription medications, including herbals, vitamins, non-steroidal anti-inflammatory drugs (NSAIDs) and supplements.  This website has more information on Eliquis (apixaban): http://[email or web address].com/eliquis/home

## 2022-08-15 NOTE — Discharge Summary (Signed)
Physician Discharge Summary  Sabrina Reed LOV:[id number] DOB: 11/21/1980 DOA: 08/14/2022  PCP: Eartha Inch, MD  Admit date: 08/14/2022  Discharge date: 08/15/2022  Admitted From: Home.  Disposition:  Home.  Recommendations for Outpatient Follow-up:  Follow up with PCP in 1-2 weeks. Please obtain BMP/CBC in one week Advised to take Eliquis 10 mg twice daily for 7 days followed by 5 mg twice daily for 6 to 9 months for pulmonary embolism. Advised to follow-up with pulmonology.  Home Health: None Equipment/Devices: None  Discharge Condition: Stable CODE STATUS:Full code Diet recommendation: Heart Healthy   Brief Summary / Hospital Course: Sabrina Reed is a 41 y.o. female with no significant past medical history presented in the ED with worsening shortness of breath and chest pain.  Patient reports she was recently diagnosed with pulmonary embolism and was started on Eliquis.  Patient reports having left flank pain for three months , CT renal study was unremarkable except for left lower lobe pulmonary opacity likely pulmonary infarct in retrospect.  Her PCP has ordered outpatient CTA chest which was positive for multiple PEs with right heart strain and patient was started on Eliquis.  She did not feel better so she presented in the ED. Patient was admitted for pulmonary embolism and was started on IV heparin.  Echocardiogram was done which was unremarkable, no right heart strain was noted.  Pulmonology was consulted, Patient was evaluated and deemed suitable for discharge on Eliquis.  Patient feels better and wanted to be discharged.  Patient is being discharged home on Eliquis and pulmonology appointment was made.  Discharge Diagnoses:  Principal Problem:   Acute pulmonary embolism with acute cor pulmonale [facility])    Discharge Instructions  Discharge Instructions     Call MD for:  difficulty breathing, headache or visual disturbances   Complete by: As directed    Call MD for:   persistant dizziness or light-headedness   Complete by: As directed    Call MD for:  persistant nausea and vomiting   Complete by: As directed    Diet - low sodium heart healthy   Complete by: As directed    Diet Carb Modified   Complete by: As directed    Discharge instructions   Complete by: As directed    Advised to follow-up with primary care physician in 1 week. Advised to take Eliquis 10 mg twice daily for 7 days followed by 5 mg twice daily for 6 to 9 months for pulmonary embolism. Advised to follow-up with pulmonology.   Increase activity slowly   Complete by: As directed       Allergies as of 08/15/2022       Reactions   Eggs Or Egg-derived Products Rash, Other (See Comments)   GI upset        Medication List     STOP taking these medications    cyclobenzaprine 10 MG tablet Commonly known as: FLEXERIL   EluRyng 0.12-0.015 MG/24HR vaginal ring Generic drug: etonogestrel-ethinyl estradiol   ibuprofen 800 MG tablet Commonly known as: ADVIL       TAKE these medications    apixaban 5 MG Tabs tablet Commonly known as: ELIQUIS Advised to take Eliquis 10 mg twice daily for 7 days followed by 5 mg twice daily for 6 to 9 months. What changed:  how much to take how to take this when to take this additional instructions   lidocaine 5 % Commonly known as: LIDODERM Place 1 patch onto the skin every  12 (twelve) hours as needed (pain). Remove & Discard patch within 12 hours or as directed by MD   naproxen 500 MG tablet Commonly known as: NAPROSYN Take 500 mg by mouth 2 (two) times daily as needed for moderate pain.   tiZANidine 4 MG tablet Commonly known as: ZANAFLEX Take 4 mg by mouth 2 (two) times daily as needed for muscle spasms.        Follow-up Information     Eartha InchBadger, Michael C, MD Follow up in 1 week(s).   Specialty: Family Medicine Contact information: [address] [place] [address] [phone]         [facility] Follow up in 1 week(s).   Specialty: Pulmonology Contact information: [address] [place] [phone]               Allergies  Allergen Reactions   Eggs Or Egg-Derived Products Rash and Other (See Comments)    GI upset    Consultations: Pulmonology   Procedures/Studies: ECHOCARDIOGRAM COMPLETE  Result Date: 08/15/2022    ECHOCARDIOGRAM REPORT   Patient Name:   Sabrina BaarsKELLY M Reed Date of Exam: 08/15/2022 Medical Rec #:  [id number]      Height:       62.0 in Accession #:    [id number]     Weight:       128.7 lb Date of Birth:  1981-06-04      BSA:          1.585 m Patient Age:    41 years       BP:           95/49 mmHg Patient Gender: F              HR:           60 bpm. Exam Location:  Inpatient Procedure: 2D Echo Indications:    Pulmonary embolus  History:        Patient has no prior history of Echocardiogram examinations.  Sonographer:    Cathie HoopsWendy Porter Referring Phys: [id number] NICOLE GONZALES  Sonographer Comments: Image acquisition challenging due to respiratory motion. IMPRESSIONS  1. Left ventricular ejection fraction, by estimation, is 60 to 65%. The left ventricle has normal function. The left ventricle has no regional wall motion abnormalities. Left ventricular diastolic parameters were normal.  2. Right ventricular systolic function is normal. The right ventricular size is normal. There is normal pulmonary artery systolic pressure.  3. The mitral valve is normal in structure. Trivial mitral valve regurgitation. No evidence of mitral stenosis.  4. The aortic valve is normal in structure. Aortic valve regurgitation is not visualized. No aortic stenosis is present.  5. The inferior vena cava is normal in size with greater than 50% respiratory variability, suggesting right atrial pressure of 3 mmHg. FINDINGS  Left Ventricle: Left ventricular ejection fraction, by estimation, is 60 to 65%. The left ventricle has normal function. The left  ventricle has no regional wall motion abnormalities. The left ventricular internal cavity size was normal in size. There is  no left ventricular hypertrophy. Left ventricular diastolic parameters were normal. Right Ventricle: The right ventricular size is normal. No increase in right ventricular wall thickness. Right ventricular systolic function is normal. There is normal pulmonary artery systolic pressure. The tricuspid regurgitant velocity is 1.99 m/s, and  with an assumed right atrial pressure of 3 mmHg, the estimated right ventricular systolic pressure is 18.8 mmHg. Left Atrium: Left atrial size was  normal in size. Right Atrium: Right atrial size was normal in size. Pericardium: There is no evidence of pericardial effusion. Mitral Valve: The mitral valve is normal in structure. Trivial mitral valve regurgitation. No evidence of mitral valve stenosis. Tricuspid Valve: The tricuspid valve is normal in structure. Tricuspid valve regurgitation is not demonstrated. No evidence of tricuspid stenosis. Aortic Valve: The aortic valve is normal in structure. Aortic valve regurgitation is not visualized. No aortic stenosis is present. Aortic valve mean gradient measures 4.0 mmHg. Aortic valve peak gradient measures 8.8 mmHg. Aortic valve area, by VTI measures 2.64 cm. Pulmonic Valve: The pulmonic valve was normal in structure. Pulmonic valve regurgitation is not visualized. No evidence of pulmonic stenosis. Aorta: The aortic root is normal in size and structure. Venous: The inferior vena cava is normal in size with greater than 50% respiratory variability, suggesting right atrial pressure of 3 mmHg. IAS/Shunts: No atrial level shunt detected by color flow Doppler.  LEFT VENTRICLE PLAX 2D LVIDd:         3.60 cm     Diastology LVIDs:         2.40 cm     LV e' medial:    12.40 cm/s LV PW:         0.80 cm     LV E/e' medial:  6.1 LV IVS:        0.80 cm     LV e' lateral:   15.70 cm/s LVOT diam:     1.90 cm     LV E/e'  lateral: 4.8 LV SV:         90 LV SV Index:   57 LVOT Area:     2.84 cm  LV Volumes (MOD) LV vol d, MOD A2C: 79.8 ml LV vol d, MOD A4C: 90.1 ml LV vol s, MOD A2C: 31.2 ml LV vol s, MOD A4C: 29.8 ml LV SV MOD A2C:     48.6 ml LV SV MOD A4C:     90.1 ml LV SV MOD BP:      57.3 ml RIGHT VENTRICLE RV Basal diam:  3.60 cm RV Mid diam:    2.60 cm RV FAC:         34.6 % TAPSE (M-mode): 2.8 cm LEFT ATRIUM             Index        RIGHT ATRIUM           Index LA diam:        2.70 cm 1.70 cm/m   RA Area:     16.20 cm LA Vol (A2C):   32.9 ml 20.75 ml/m  RA Volume:   47.30 ml  29.84 ml/m LA Vol (A4C):   27.4 ml 17.28 ml/m LA Biplane Vol: 30.9 ml 19.49 ml/m  AORTIC VALVE                    PULMONIC VALVE AV Area (Vmax):    2.62 cm     PV Vmax:       0.99 m/s AV Area (Vmean):   2.65 cm     PV Peak grad:  3.9 mmHg AV Area (VTI):     2.64 cm AV Vmax:           148.00 cm/s AV Vmean:          97.000 cm/s AV VTI:            0.341 m AV Peak Grad:  8.8 mmHg AV Mean Grad:      4.0 mmHg LVOT Vmax:         137.00 cm/s LVOT Vmean:        90.700 cm/s LVOT VTI:          0.318 m LVOT/AV VTI ratio: 0.93  AORTA Ao Root diam: 3.30 cm MITRAL VALVE               TRICUSPID VALVE MV Area (PHT): 2.80 cm    TR Peak grad:   15.8 mmHg MV Decel Time: 271 msec    TR Vmax:        199.00 cm/s MR Peak grad: 13.3 mmHg MR Vmax:      182.50 cm/s  SHUNTS MV E velocity: 75.30 cm/s  Systemic VTI:  0.32 m MV A velocity: 41.00 cm/s  Systemic Diam: 1.90 cm MV E/A ratio:  1.84 Charlton Haws MD Electronically signed by Charlton Haws MD Signature Date/Time: 08/15/2022/8:47:53 AM    Final    DG CHEST PORT 1 VIEW  Result Date: 08/15/2022 CLINICAL DATA:  Pneumonia. Pulmonary emboli diagnosed on outside chest CTA. EXAM: PORTABLE CHEST 1 VIEW COMPARISON:  Chest radiograph 05/26/2022 FINDINGS: The cardiomediastinal silhouette is within normal limits. Lung volumes are mildly low. Mild ground-glass opacity is present in the left lung base. The right lung is  clear. No edema, sizable pleural effusion, or pneumothorax is identified. No acute osseous abnormality is seen. IMPRESSION: Mild left basilar opacity, possibly infarct in the setting of recently diagnosed pulmonary emboli versus atelectasis or infection. Electronically Signed   By: Sebastian Ache M.D.   On: 08/15/2022 07:59   US Venous Img Lower Bilateral (DVT)  Result Date: 08/14/2022 CLINICAL DATA:  Shortness of breath, recent PE EXAM: BILATERAL LOWER EXTREMITY VENOUS DOPPLER ULTRASOUND TECHNIQUE: Gray-scale sonography with compression, as well as color and duplex ultrasound, were performed to evaluate the deep venous system(s) from the level of the common femoral vein through the popliteal and proximal calf veins. COMPARISON:  None Available. FINDINGS: VENOUS Normal compressibility of the common femoral, superficial femoral, and popliteal veins, as well as the visualized calf veins. Visualized portions of profunda femoral vein and great saphenous vein unremarkable. No filling defects to suggest DVT on grayscale or color Doppler imaging. Doppler waveforms show normal direction of venous flow, normal respiratory plasticity and response to augmentation. OTHER None. Limitations: none IMPRESSION: Negative. Electronically Signed   By: Charline Bills M.D.   On: 08/14/2022 23:18     Subjective: Patient was seen and examined at bedside.  Overnight events noted.   Patient is lying comfortably without any chest pain and states she wants to be discharged.  Discharge Exam: Vitals:   08/15/22 1000 08/15/22 1005  BP:  92/60  Pulse: 64 (!) 25  Resp: 17 18  Temp:    SpO2: 99% 98%   Vitals:   08/15/22 0950 08/15/22 0955 08/15/22 1000 08/15/22 1005  BP:    92/60  Pulse: 74 77 64 (!) 25  Resp: 18 19 17 18   Temp:      TempSrc:      SpO2: 100% 99% 99% 98%  Weight:      Height:        General: Pt is alert, awake, not in acute distress Cardiovascular: RRR, S1/S2 +, no rubs, no gallops Respiratory: CTA  bilaterally, no wheezing, no rhonchi Abdominal: Soft, NT, ND, bowel sounds + Extremities: no edema, no cyanosis    The results of significant diagnostics from this  hospitalization (including imaging, microbiology, ancillary and laboratory) are listed below for reference.     Microbiology: Recent Results (from the past 240 hour(s))  MRSA Next Gen by PCR, Nasal     Status: None   Collection Time: 08/15/22  1:34 AM   Specimen: Nasal Mucosa; Nasal Swab  Result Value Ref Range Status   MRSA by PCR Next Gen NOT DETECTED NOT DETECTED Final    Comment: (NOTE) The GeneXpert MRSA Assay (FDA approved for NASAL specimens only), is one component of a comprehensive MRSA colonization surveillance program. It is not intended to diagnose MRSA infection nor to guide or monitor treatment for MRSA infections. Test performance is not FDA approved in patients less than 38 years old. Performed at [facility], [address], [place], [place] [address]      Labs: BNP (last 3 results) Recent Labs    08/14/22 2017  BNP 92.2   Basic Metabolic Panel: Recent Labs  Lab 08/14/22 2017  NA 136  K 4.3  CL 102  CO2 23  GLUCOSE 84  BUN 12  CREATININE 0.82  CALCIUM 9.9   Liver Function Tests: Recent Labs  Lab 08/14/22 2017  AST 14*  ALT 11  ALKPHOS 50  BILITOT 0.5  PROT 7.7  ALBUMIN 4.0   No results for input(s): "LIPASE", "AMYLASE" in the last 168 hours. No results for input(s): "AMMONIA" in the last 168 hours. CBC: Recent Labs  Lab 08/14/22 2017 08/15/22 0627  WBC 7.2 6.6  HGB 11.6* 11.1*  HCT 35.0* 33.3*  MCV 91.6 92.2  PLT 231 223   Cardiac Enzymes: No results for input(s): "CKTOTAL", "CKMB", "CKMBINDEX", "TROPONINI" in the last 168 hours. BNP: Invalid input(s): "POCBNP" CBG: No results for input(s): "GLUCAP" in the last 168 hours. D-Dimer No results for input(s): "DDIMER" in the last 72 hours. Hgb A1c No results for input(s): "HGBA1C" in the last 72  hours. Lipid Profile No results for input(s): "CHOL", "HDL", "LDLCALC", "TRIG", "CHOLHDL", "LDLDIRECT" in the last 72 hours. Thyroid function studies No results for input(s): "TSH", "T4TOTAL", "T3FREE", "THYROIDAB" in the last 72 hours.  Invalid input(s): "FREET3" Anemia work up No results for input(s): "VITAMINB12", "FOLATE", "FERRITIN", "TIBC", "IRON", "RETICCTPCT" in the last 72 hours. Urinalysis    Component Value Date/Time   COLORURINE YELLOW 05/26/2022 1804   APPEARANCEUR CLEAR 05/26/2022 1804   LABSPEC 1.017 05/26/2022 1804   PHURINE 6.5 05/26/2022 1804   GLUCOSEU NEGATIVE 05/26/2022 1804   HGBUR SMALL (A) 05/26/2022 1804   BILIRUBINUR NEGATIVE 05/26/2022 1804   KETONESUR NEGATIVE 05/26/2022 1804   PROTEINUR TRACE (A) 05/26/2022 1804   NITRITE NEGATIVE 05/26/2022 1804   LEUKOCYTESUR MODERATE (A) 05/26/2022 1804   Sepsis Labs Recent Labs  Lab 08/14/22 2017 08/15/22 0627  WBC 7.2 6.6   Microbiology Recent Results (from the past 240 hour(s))  MRSA Next Gen by PCR, Nasal     Status: None   Collection Time: 08/15/22  1:34 AM   Specimen: Nasal Mucosa; Nasal Swab  Result Value Ref Range Status   MRSA by PCR Next Gen NOT DETECTED NOT DETECTED Final    Comment: (NOTE) The GeneXpert MRSA Assay (FDA approved for NASAL specimens only), is one component of a comprehensive MRSA colonization surveillance program. It is not intended to diagnose MRSA infection nor to guide or monitor treatment for MRSA infections. Test performance is not FDA approved in patients less than 56 years old. Performed at [facility], [address] [place], [place]  [address]      Time coordinating discharge: Over 30 minutes  SIGNED:   Cipriano Bunker, MD  [facility] 08/15/2022, 10:50 AM Pager   If 7PM-7AM, please contact night-coverage

## 2022-08-15 NOTE — Progress Notes (Incomplete)
  Echocardiogram 2D Echocardiogram has been performed.  Sabrina Reed 08/15/2022, 8:24 AM

## 2022-08-15 NOTE — Progress Notes (Signed)
ANTICOAGULATION CONSULT NOTE - Follow Up  Pharmacy Consult for heparin Indication: pulmonary embolus  Allergies  Allergen Reactions   Eggs Or Egg-Derived Products Other (See Comments)    GI upset    Patient Measurements: Height: 5\' 2"  (157.5 cm) Weight: 58.4 kg (128 lb 12 oz) IBW/kg (Calculated) : 50.1 Heparin Dosing Weight: n/a. Use TBW of 58 kg  Vital Signs: Temp: 98.1 F (36.7 C) (11/09 0404) Temp Source: Oral (11/09 0404) BP: 95/49 (11/09 0500) Pulse Rate: 84 (11/09 0500)  Labs: Recent Labs    08/14/22 2017 08/14/22 2345 08/15/22 0627  HGB 11.6*  --  11.1*  HCT 35.0*  --  33.3*  PLT 231  --  223  APTT  --   --  59*  HEPARINUNFRC  --   --  >1.10*  CREATININE 0.82  --   --   TROPONINIHS <2 <2  --      Estimated Creatinine Clearance: 72.1 mL/min (by C-G formula based on SCr of 0.82 mg/dL).   Medical History: Past Medical History:  Diagnosis Date   Family history of breast cancer    Family history of kidney cancer    GERD (gastroesophageal reflux disease)    with pregnancy   H/O varicella    Newborn product of IVF pregnancy    Assessment: 41 year old female presenting with worsening shortness of breath and pain in the left flank area. History of PE diagnosed on 08/12/22 by outpatient CT scan and started on apixaban. Last dose of apixaban on 11/8 at 1730. Pharmacy has been consulted to dose heparin.  Baseline HL was not obtained, but would expect it to be falsely high given recent DOAC use. Will monitor heparin using aPTT until aPTT and HL correlate.   Today, 08/15/22 aPTT = 59 seconds is subtherapeutic on heparin infusion of 1000 units/hr. HL >1.1 falsely elevated due to recent apixaban CBC: Hgb (11.1) slightly low but stable; Plt WNL/stable Confirmed with RN that heparin infusing at correct rate. No line issues or interruptions. No signs of bleeding.   Goal of Therapy:  Heparin level 0.3-0.7 units/ml aPTT 66-102 seconds Monitor platelets by  anticoagulation protocol: Yes   Plan:  Heparin bolus of 1000 units IV once Increase heparin infusion to 1100 units/hr Check aPTT 6 hours from rate increase CBC, HL/aPTT daily while on heparin infusion Monitor for signs of bleeding  13/09/23, PharmD 08/15/22 8:03 AM

## 2022-08-15 NOTE — H&P (Addendum)
History and Physical    Patient: Sabrina Reed FWY:[id number] DOB: 04-27-1981 DOA: 08/14/2022 DOS: the patient was seen and examined on 08/15/2022 PCP: Eartha Inch, MD  Patient coming from: Home  Chief Complaint:  Chief Complaint  Patient presents with   Shortness of Breath   HPI: Sabrina Reed is a 41 y.o. female with medical history significant of previously healthy.  Pt with L flank pain since Aug.  Episodes of sharp intense pain, recent acute dyspnea and presyncopal episodes.  CT renal stone study 10/27 unremarkable except for LLL pulmonary opacity (likely pulmonary infarct in retrospect).  Follow up with PCP who ordered outpt chest CTA on 11/6: this was positive for multiple PEs, RHS, and LLL ground glass opacity possibly developing pulmonary infarct.  Started on eliquis as outpt.  Due to persistent symptoms, in to ED at med center today.  Recent air travel, but symptoms onset prior to air travel per patient.  Review of Systems: As mentioned in the history of present illness. All other systems reviewed and are negative. Past Medical History:  Diagnosis Date   Family history of breast cancer    Family history of kidney cancer    GERD (gastroesophageal reflux disease)    with pregnancy   H/O varicella    Newborn product of IVF pregnancy    Past Surgical History:  Procedure Laterality Date   CESAREAN SECTION  10/07/2012   Procedure: CESAREAN SECTION;  Surgeon: Meriel Pica, MD;  Location: [facility];  Service: Obstetrics;  Laterality: N/A;   CESAREAN SECTION N/A 02/27/2015   Procedure: CESAREAN SECTION;  Surgeon: Candice Camp, MD;  Location: [facility];  Service: Obstetrics;  Laterality: N/A;  Repeat edc 5/29   REFRACTIVE SURGERY     WISDOM TOOTH EXTRACTION     Social History:  reports that she has never smoked. She has never used smokeless tobacco. She reports that she does not currently use alcohol. She reports that she does not use drugs.  Allergies  Allergen  Reactions   Eggs Or Egg-Derived Products Other (See Comments)    GI upset    Family History  Problem Relation Age of Onset   Thyroid disease Mother    Autoimmune disease Mother    Cancer Mother 23       kidney   Mental retardation Cousin    Autoimmune disease Cousin    Cancer Father 71       prostate   Autoimmune disease Maternal Aunt    Breast cancer Maternal Aunt 107   Cancer Maternal Grandmother 40       breast   Prostate cancer Maternal Grandfather 31   Melanoma Maternal Grandfather    Leukemia Maternal Grandfather    Drug abuse Paternal Grandmother    Drug abuse Paternal Grandfather    Alcohol abuse Paternal Grandfather     Prior to Admission medications   Medication Sig Start Date End Date Taking? Authorizing Provider  cyclobenzaprine (FLEXERIL) 10 MG tablet Take 1 tablet (10 mg total) by mouth 2 (two) times daily as needed for muscle spasms. 05/26/22   Ernie Avena, MD  ibuprofen (ADVIL) 800 MG tablet Take 1 tablet (800 mg total) by mouth 3 (three) times daily. 05/26/22   Ernie Avena, MD  oxyCODONE-acetaminophen (PERCOCET/ROXICET) 5-325 MG per tablet Take 1 tablet by mouth every 4 (four) hours as needed (for pain scale 4-7). 03/01/15   Julio Sicks, NP  Prenatal Vit-Fe Fumarate-FA (PRENATAL MULTIVITAMIN) TABS tablet Take 1 tablet by mouth daily  at 12 noon.    [provider]    Physical Exam: Vitals:   08/15/22 0300 08/15/22 0400 08/15/22 0404 08/15/22 0500  BP: (!) 95/56 101/61  (!) 95/49  Pulse: (!) 51 (!) 59  84  Resp: 15 18  20   Temp:   98.1 F (36.7 C)   TempSrc:   Oral   SpO2: 98% 99%  98%  Weight:      Height:       Constitutional: NAD, calm, comfortable Eyes: PERRL, lids and conjunctivae normal ENMT: Mucous membranes are moist. Posterior pharynx clear of any exudate or lesions.Normal dentition.  Neck: normal, supple, no masses, no thyromegaly Respiratory: clear to auscultation bilaterally, no wheezing, no crackles. Normal respiratory  effort. No accessory muscle use.  Cardiovascular: Regular rate and rhythm, no murmurs / rubs / gallops. No extremity edema. 2+ pedal pulses. No carotid bruits.  Abdomen: no tenderness, no masses palpated. No hepatosplenomegaly. Bowel sounds positive.  Musculoskeletal: no clubbing / cyanosis. No joint deformity upper and lower extremities. Good ROM, no contractures. Normal muscle tone.  Skin: no rashes, lesions, ulcers. No induration Neurologic: CN 2-12 grossly intact. Sensation intact, DTR normal. Strength 5/5 in all 4.  Psychiatric: Normal judgment and insight. Alert and oriented x 3. Normal mood.   Data Reviewed:    CBC and CMP unremarkable  [facility] chest at [facility] 11/6: IMPRESSION: There are filling defects seen within the left upper and lower lobe segmental arteries as well as the distal left main pulmonary artery and right upper and lower segmental arteries consistent with pulmonary emboli. No saddle embolism is seen. There is prominence of the right ventricle suggesting right heart strain.   There is a groundglass opacity within the left lung base favored to represent an evolving infarct or an inflammatory/infectious etiology. This will need interval follow-up within 2-3 months could less likely represent a developing nodule.   BLE 13/6 neg for DVTs today  CT AP w/o IV contrast [facility] 10/27: IMPRESSION:  1.  No renal or ureteral calculi or obstruction.   2.  Small oval peripheral opacity in the left lower lobe could represent early pneumonia or atypical area of subsegmental atelectasis versus mass. There are other peripheral patchy opacities in the lower lobes bilaterally which are more typical of atelectasis. Suggest short interval completion chest CT to document resolution.   Trop neg x2  BNP nl.  Serum preg neg.  Assessment and Plan: * Acute pulmonary embolism with acute cor pulmonale (HCC) Acute PEs with pulmonary infarct causing R sided flank / back pain.  In retrospect this was  also present on CT at end of Oct. Clinically opacity is most c/w pulmonary infarct as well given the pleuritic pain, lack of SIRS, etc. Heparin GTT for the moment No DVT in legs No obvious malignancy on CTA chest nor recent CT AP May need follow up CT chest for the LLL opacity as per rads report. ? Provoked by Nuvaring hormonal birth control possibly ? Pt has already [facility] this at recommendation of PCP. Getting 2d echo. IV morphine PRN pain.      Advance Care Planning:   Code Status: Full Code  Consults: EDP spoke with PCCM it looks like  Family Communication: Husband at bedside  Severity of Illness: The appropriate patient status for this patient is INPATIENT. Inpatient status is judged to be reasonable and necessary in order to provide the required intensity of service to ensure the patient's safety. The patient's presenting symptoms, physical exam findings,  and initial radiographic and laboratory data in the context of their chronic comorbidities is felt to place them at high risk for further clinical deterioration. Furthermore, it is not anticipated that the patient will be medically stable for discharge from the hospital within 2 midnights of admission.   * I certify that at the point of admission it is my clinical judgment that the patient will require inpatient hospital care spanning beyond 2 midnights from the point of admission due to high intensity of service, high risk for further deterioration and high frequency of surveillance required.*  Author: Hillary Bow., DO 08/15/2022 5:14 AM  For on call review [email or web address].

## 2022-08-15 NOTE — Consult Note (Addendum)
NAME:  Sabrina Reed, MRN:  [id number], DOB:  08/30/1981, LOS: 0 ADMISSION DATE:  08/14/2022, CONSULTATION DATE:  08/14/22 REFERRING MD:  Lynelle Doctor, CHIEF COMPLAINT:  L back pain (flank)   History of Present Illness:  41 yo woman with hx of L flank pain intermittently since 05/2022, diagnosed with PE on outpatient CT scan.  Send to ED for ongoing pain.     11/6 PCP ordered OP CTA, read came back early 11/8 am with multiple PEs, possible evolving infarct or inflammatory infectious etiology.    Was started on Eliquis but instructed to come to ED due to increasing pain over the course of the day.    She is a runner, normal bp 90s/60s, low HR.  Has been sob going up stairs for several weeks.   Symptoms began in August but have worsened in last 2 weeks.  Was initially treated as muscle spasms, CT renal stone study was negative.   Pain is pleuritic in nature.   Flexeril was initially helpful but past few days no longer helpful.   Recent travel to [place], but after symptoms developed.  Car travel prior to that 4-5 hrs Family hx of clot in mother who had active malignancy Otherwise no known hx  No mammogram done, No recent symptoms suggestive of underlying illness.  No colonoscopy.   For birth control, had nuvaring, removed after diagnosis.  Pertinent  Medical History   Home Meds: flexeril, ibuprofen Significant Hospital Events: Including procedures, antibiotic start and stop dates in addition to other pertinent events     Interim History / Subjective:    Objective   Blood pressure 103/67, pulse (!) 59, temperature 98.4 F (36.9 C), resp. rate 19, height 5\' 2"  (1.575 m), weight 58.4 kg, last menstrual period 07/24/2022, SpO2 99 %, unknown if currently breastfeeding.       No intake or output data in the 24 hours ending 08/15/22 0216 Filed Weights   08/14/22 1840 08/15/22 0121  Weight: 58.1 kg 58.4 kg    Examination: General: pleasant nad  HENT: ncat  Lungs: Crackles at L lung base   Cardiovascular: rrr no mgr  Abdomen: non distended  Neuro: a and O x 3  Some tenderness to palp at L flank.   LE 13/09/23 neg for DVT  CTA chest:  There are filling defects seen within the left upper and lower lobe segmental arteries as well as the distal left main pulmonary artery and right upper and lower segmental arteries consistent with pulmonary emboli. No saddle embolism is seen. There is prominence of the right ventricle suggesting right heart strain.   There is a groundglass opacity within the left lung base favored to represent an evolving infarct or an inflammatory/infectious etiology. This will need interval follow-up within 2-3 months could less likely represent a developing nodule.  Resolved Hospital Problem list     Assessment & Plan:  PE:  RV strain noted on CT scan.   PE possibly subacute given clinical hx.   Trop and BNP normal.   Evolving infarct or inflammation on CT at L lung base, likely explanation for worsening pleuritic pain.   Could be some associated muscle spasm at pain location as well given her tenderness.  PESI score 70, low risk mortality, although sthis estimate even may be falsely elevated by the SBP being technically low, although it appears normal for her.   Cont Heparin.  No need for systemic lytics or catheter guided lytics emergently.   Echocardiogram ordered.  No clinical evidence for active infection.   Would be helpful to review the actual images from the outpatient CT, will need to request the ct images for our system.    PCCM will continue to follow.   Best Practice (right click and "Reselect all SmartList Selections" daily)     Labs   CBC: Recent Labs  Lab 08/14/22 2017  WBC 7.2  HGB 11.6*  HCT 35.0*  MCV 91.6  PLT 231    Basic Metabolic Panel: Recent Labs  Lab 08/14/22 2017  NA 136  K 4.3  CL 102  CO2 23  GLUCOSE 84  BUN 12  CREATININE 0.82  CALCIUM 9.9   GFR: Estimated Creatinine Clearance: 72.1 mL/min (by C-G  formula based on SCr of 0.82 mg/dL). Recent Labs  Lab 08/14/22 2017  WBC 7.2    Liver Function Tests: Recent Labs  Lab 08/14/22 2017  AST 14*  ALT 11  ALKPHOS 50  BILITOT 0.5  PROT 7.7  ALBUMIN 4.0   No results for input(s): "LIPASE", "AMYLASE" in the last 168 hours. No results for input(s): "AMMONIA" in the last 168 hours.  ABG No results found for: "PHART", "PCO2ART", "PO2ART", "HCO3", "TCO2", "ACIDBASEDEF", "O2SAT"   Coagulation Profile: No results for input(s): "INR", "PROTIME" in the last 168 hours.  Cardiac Enzymes: No results for input(s): "CKTOTAL", "CKMB", "CKMBINDEX", "TROPONINI" in the last 168 hours.  HbA1C: No results found for: "HGBA1C"  CBG: No results for input(s): "GLUCAP" in the last 168 hours.  Review of Systems:   Review of Systems  Constitutional:  Negative for fever.  HENT:  Negative for hearing loss.   Eyes:  Negative for blurred vision.  Respiratory:  Positive for shortness of breath. Negative for cough, hemoptysis, sputum production and wheezing.   Cardiovascular:  Positive for chest pain. Negative for palpitations, orthopnea, claudication, leg swelling and PND.  Gastrointestinal:  Negative for heartburn.  Genitourinary:  Negative for dysuria.  Musculoskeletal:  Negative for myalgias.  Skin:  Negative for rash.  Neurological:  Negative for dizziness.  Endo/Heme/Allergies:  Does not bruise/bleed easily.  Psychiatric/Behavioral:  Negative for depression.      Past Medical History:  She,  has a past medical history of Family history of breast cancer, Family history of kidney cancer, GERD (gastroesophageal reflux disease), H/O varicella, and Newborn product of IVF pregnancy.   Surgical History:   Past Surgical History:  Procedure Laterality Date   CESAREAN SECTION  10/07/2012   Procedure: CESAREAN SECTION;  Surgeon: Meriel Pica, MD;  Location: [facility];  Service: Obstetrics;  Laterality: N/A;   CESAREAN SECTION N/A 02/27/2015    Procedure: CESAREAN SECTION;  Surgeon: Candice Camp, MD;  Location: [facility];  Service: Obstetrics;  Laterality: N/A;  Repeat edc 5/29   REFRACTIVE SURGERY     WISDOM TOOTH EXTRACTION       Social History:   reports that she has never smoked. She has never used smokeless tobacco. She reports that she does not currently use alcohol. She reports that she does not use drugs.   Recent travel to [place], but aafter symptoms developed.  Car travel prior to that 4-5 hrs Family hx of clot in mother who had active malignancy Otherwise no known hx  No mammogram done, No recent symptoms suggestive of underlying illness.  No colonoscopy.    Family History:  Her family history includes Alcohol abuse in her paternal grandfather; Autoimmune disease in her cousin, maternal aunt, and mother; Breast cancer (age of onset:  56) in her maternal aunt; Cancer (age of onset: 28) in her maternal grandmother; Cancer (age of onset: 75) in her mother; Cancer (age of onset: 15) in her father; Drug abuse in her paternal grandfather and paternal grandmother; Leukemia in her maternal grandfather; Melanoma in her maternal grandfather; Mental retardation in her cousin; Prostate cancer (age of onset: 1) in her maternal grandfather; Thyroid disease in her mother.   Allergies Allergies  Allergen Reactions   Eggs Or Egg-Derived Products Other (See Comments)    GI upset     Home Medications  Prior to Admission medications   Medication Sig Start Date End Date Taking? Authorizing Provider  cyclobenzaprine (FLEXERIL) 10 MG tablet Take 1 tablet (10 mg total) by mouth 2 (two) times daily as needed for muscle spasms. 05/26/22   Ernie Avena, MD  ibuprofen (ADVIL) 800 MG tablet Take 1 tablet (800 mg total) by mouth 3 (three) times daily. 05/26/22   Ernie Avena, MD  oxyCODONE-acetaminophen (PERCOCET/ROXICET) 5-325 MG per tablet Take 1 tablet by mouth every 4 (four) hours as needed (for pain scale 4-7). 03/01/15   Julio Sicks, NP   Prenatal Vit-Fe Fumarate-FA (PRENATAL MULTIVITAMIN) TABS tablet Take 1 tablet by mouth daily at 12 noon.    [provider]     Critical care time: 45 min

## 2022-08-15 NOTE — Telephone Encounter (Signed)
Can we set up pulm clinic follow up in 3 months?  Thanks!

## 2022-08-15 NOTE — Progress Notes (Signed)
Brief PCCM Progress Note  No acute issues overnight, still with pleuritic CP. TTE without evidence of right heart strain. US DVT negative. HDS and on room air with normal WOB.   # Subacute, questionable unprovoked vs provoked PE with radiographic right heart strain but without echocardiographic right heart strain, normal biomarkers # GGO LLL - ok to discharge home with eliquis, anticipate need for at least 3 month and likely longer course as long as she's tolerating AC - surveillance CT Chest for ggo and clinic visit in pulm clinic in 3 months requested  Will sign off but glad to be reinvolved as condition changes.  Sabrina Reed Pulmonary/Critical Care

## 2022-08-15 NOTE — Telephone Encounter (Signed)
Scheduled HFU with Tammy on 02/09.  At this time, no providers have their calendars out that far. Nothing further needed.

## 2022-11-12 ENCOUNTER — Ambulatory Visit (HOSPITAL_BASED_OUTPATIENT_CLINIC_OR_DEPARTMENT_OTHER): Admission: RE | Admit: 2022-11-12 | Payer: 59 | Source: Ambulatory Visit

## 2022-11-14 ENCOUNTER — Ambulatory Visit (HOSPITAL_BASED_OUTPATIENT_CLINIC_OR_DEPARTMENT_OTHER): Admission: RE | Admit: 2022-11-14 | Payer: 59 | Source: Ambulatory Visit

## 2022-11-15 ENCOUNTER — Inpatient Hospital Stay: Payer: Self-pay | Admitting: Adult Health

## 2022-11-15 ENCOUNTER — Telehealth: Payer: Self-pay | Admitting: Student

## 2022-11-15 NOTE — Telephone Encounter (Signed)
A message has been sent to Dr. Verlee Monte requesting guidance on this one - peer to peer was not an option per denial, but an appeal is possible. Waiting on response from Dr. Verlee Monte.

## 2022-11-18 NOTE — Telephone Encounter (Signed)
I have spoken to Sabrina Reed to let her know I am waiting to hear back from Dr. Verlee Monte.

## 2022-11-18 NOTE — Telephone Encounter (Addendum)
Pt's insurance will allow a P2P - Dr. Verlee Monte will complete 2/13 @ 12:15 with [facility] Dr. Arville Go pt aware & r/s pt's CT to 2/18 @ 10 & HFU was moved to 2/20 @ 9 w/ BW. Pt aware she will not hear from me if Dr. Verlee Monte able to get CT approved.

## 2022-11-19 ENCOUNTER — Ambulatory Visit (HOSPITAL_BASED_OUTPATIENT_CLINIC_OR_DEPARTMENT_OTHER): Payer: 59

## 2022-11-19 NOTE — Telephone Encounter (Signed)
Dr. Verlee Monte was able to obtain the auth.  Notified pt of approval.  Nothing further needed at this time.

## 2022-11-20 ENCOUNTER — Inpatient Hospital Stay: Payer: Self-pay | Admitting: Adult Health

## 2022-11-24 ENCOUNTER — Telehealth: Payer: Self-pay | Admitting: Pulmonary Disease

## 2022-11-24 ENCOUNTER — Ambulatory Visit (HOSPITAL_BASED_OUTPATIENT_CLINIC_OR_DEPARTMENT_OTHER)
Admission: RE | Admit: 2022-11-24 | Discharge: 2022-11-24 | Disposition: A | Discharge status: Home / Self Care | Payer: 59 | Source: Ambulatory Visit | Attending: Student | Admitting: Student

## 2022-11-24 ENCOUNTER — Encounter (HOSPITAL_BASED_OUTPATIENT_CLINIC_OR_DEPARTMENT_OTHER): Payer: Self-pay

## 2022-11-24 DIAGNOSIS — R911 Solitary pulmonary nodule: Secondary | ICD-10-CM

## 2022-11-24 DIAGNOSIS — I2693 Single subsegmental pulmonary embolism without acute cor pulmonale: Secondary | ICD-10-CM

## 2022-11-24 NOTE — Telephone Encounter (Signed)
CT tech calling from [facility] patient is there for the study. Has a history of PE in November 2023 and apparently had a groundglass nodule. Previous imaging not available. Study ordered was a CT chest without contrast [facility] is questioning whether this needs to be a contrast study. Changed to CT with contrast so I can evaluate both emboli as well as groundglass nodule

## 2022-11-26 ENCOUNTER — Inpatient Hospital Stay: Payer: 59 | Admitting: Primary Care

## 2022-11-26 NOTE — Progress Notes (Unsigned)
 @  Patient ID: Sabrina Reed, female    DOB: 11-10-1980, 42 y.o.   MRN: [id number]  No chief complaint on file.   Referring provider: Chesley Noon, MD  HPI:   Allergies  Allergen Reactions   Eggs Or Egg-Derived Products Rash and Other (See Comments)    GI upset    Immunization History  Administered Date(s) Administered   PFIZER(Purple Top)SARS-COV-2 Vaccination 04/17/2021   Pfizer Covid-19 Vaccine Bivalent Booster 35yr & up 12/27/2019, 01/21/2020, 08/02/2020    Past Medical History:  Diagnosis Date   Family history of breast cancer    Family history of kidney cancer    GERD (gastroesophageal reflux disease)    with pregnancy   H/O varicella    Newborn product of IVF pregnancy     Tobacco History: Social History   Tobacco Use  Smoking Status Never  Smokeless Tobacco Never   Counseling given: Not Answered   Outpatient Medications Prior to Visit  Medication Sig Dispense Refill   apixaban (ELIQUIS) 5 MG TABS tablet Advised to take Eliquis 10 mg twice daily for 7 days followed by 5 mg twice daily for 6 to 9 months. 60 tablet 6   lidocaine (LIDODERM) 5 % Place 1 patch onto the skin every 12 (twelve) hours as needed (pain). Remove & Discard patch within 12 hours or as directed by MD     naproxen (NAPROSYN) 500 MG tablet Take 500 mg by mouth 2 (two) times daily as needed for moderate pain.     tiZANidine (ZANAFLEX) 4 MG tablet Take 4 mg by mouth 2 (two) times daily as needed for muscle spasms.     No facility-administered medications prior to visit.      Review of Systems  Review of Systems   Physical Exam  There were no vitals taken for this visit. Physical Exam   Lab Results:  CBC    Component Value Date/Time   WBC 6.6 08/15/2022 0627   RBC 3.61 (L) 08/15/2022 0627   HGB 11.1 (L) 08/15/2022 0627   HCT 33.3 (L) 08/15/2022 0627   PLT 223 08/15/2022 0627   MCV 92.2 08/15/2022 0627   MCH 30.7 08/15/2022 0627   MCHC 33.3 08/15/2022 0627   RDW  12.2 08/15/2022 0627    BMET    Component Value Date/Time   NA 136 08/14/2022 2017   K 4.3 08/14/2022 2017   CL 102 08/14/2022 2017   CO2 23 08/14/2022 2017   GLUCOSE 84 08/14/2022 2017   BUN 12 08/14/2022 2017   CREATININE 0.82 08/14/2022 2017   CALCIUM 9.9 08/14/2022 2017   GFRNONAA >60 08/14/2022 2017    BNP    Component Value Date/Time   BNP 92.2 08/14/2022 2017    ProBNP No results found for: "PROBNP"  Imaging: No results found.   Assessment & Plan:   No problem-specific Assessment & Plan notes found for this encounter.     EMartyn Ehrich NP 11/26/2022

## 2022-11-27 ENCOUNTER — Telehealth: Payer: Self-pay | Admitting: Pulmonary Disease

## 2022-11-27 NOTE — Telephone Encounter (Signed)
Defer to Dr. Verlee Monte. CT tech had called me as the covering doc on the weekend since she also has a history of PE and I thought CT chest with contrast would cover both nodules and PE.

## 2022-11-27 NOTE — Telephone Encounter (Signed)
-----   Message from Dawayne Cirri sent at 11/27/2022  9:31 AM EST ----- Regarding: CT Scan Good Morning, you ordered a CT of Chest w/ Contrast on 11/24/22. On 11/19/22. Dr. Verlee Monte did a peer to peer for CT of Chest without Contrast and got this test approved. I need to know what test needs to be done. Please advise

## 2022-11-30 ENCOUNTER — Ambulatory Visit (HOSPITAL_BASED_OUTPATIENT_CLINIC_OR_DEPARTMENT_OTHER)
Admission: RE | Admit: 2022-11-30 | Discharge: 2022-11-30 | Disposition: A | Discharge status: Home / Self Care | Payer: 59 | Source: Ambulatory Visit | Attending: Pulmonary Disease | Admitting: Pulmonary Disease

## 2022-11-30 DIAGNOSIS — I2693 Single subsegmental pulmonary embolism without acute cor pulmonale: Secondary | ICD-10-CM | POA: Insufficient documentation

## 2022-11-30 DIAGNOSIS — R911 Solitary pulmonary nodule: Secondary | ICD-10-CM

## 2022-11-30 MED ORDER — IOHEXOL 300 MG/ML  SOLN
100.0000 mL | Freq: Once | INTRAMUSCULAR | Status: AC | PRN
  Administered 2022-11-30: 80 mL via INTRAVENOUS

## 2022-12-04 ENCOUNTER — Ambulatory Visit (INDEPENDENT_AMBULATORY_CARE_PROVIDER_SITE_OTHER): Payer: 59 | Admitting: Pulmonary Disease

## 2022-12-04 ENCOUNTER — Encounter: Payer: Self-pay | Admitting: Pulmonary Disease

## 2022-12-04 ENCOUNTER — Ambulatory Visit: Payer: 59 | Admitting: Primary Care

## 2022-12-04 VITALS — BP 116/64 | HR 65 | Ht 62.0 in | Wt 130.0 lb

## 2022-12-04 DIAGNOSIS — D6852 Prothrombin gene mutation: Secondary | ICD-10-CM | POA: Diagnosis not present

## 2022-12-04 DIAGNOSIS — I2694 Multiple subsegmental pulmonary emboli without acute cor pulmonale: Secondary | ICD-10-CM

## 2022-12-04 NOTE — Patient Instructions (Signed)
You recent CT Chest scan looks good with resolution of blood clot and no other concerning areas to follow.  Continue eliquis twice daily  Follow up with hematology  Follow up as needed.

## 2022-12-04 NOTE — Progress Notes (Signed)
Synopsis: Referred in February 2024 for hospital follow up  Subjective:   PATIENT ID: Sabrina Reed GENDER: female DOB: [birth year], MRN: [id number]   HPI  Chief Complaint  Patient presents with   Consult    F/U for PE. Had a CT on 2/24. Denies any current breathing concerns   Sabrina Reed is a 42 year old woman, never smoker who is referred to pulmonary clinic for pulmonary emboli.  She was admitted 11/8 to 11/9 for pulmonary emboli involving the multiple segmental arteries and distal left main artery. She was treated with heparin and transition to eliquis. She has tolerated therpay well, no issues with bleeding.   She had follow up scan for LLL ground glass opacities on 11/30/22 which shows resolution of the ground glass opacities. Minimal basilar atelectasis and no filling defects nows.   She was seen by Hematology at [facility] by Dr. Nena Polio on 09/12/2022. She tested positive for prothrombin gene mutation. Recommendation was to stay on eliquis '5mg'$  twice daily until follow up in 6 months with likely transition to prophylactic dosing.   Patient does not have issues with dyspnea, chest pain, cough or wheezing. She is active with running and working out. She is able to run 5-6 miles without issues.  She is a never smoker. She was previously on hormone contraceptive medication until her hospital stay. Her mother had history of renal cancer and developed blood clots undergoing cancer treatment. Her cousin has history of clots. Her maternal aunt and uncle both have prothrombin gene mutation. 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  Past Medical History:  Diagnosis Date   Family history of breast cancer    Family history of kidney cancer    GERD (gastroesophageal reflux disease)    with pregnancy   H/O varicella    Newborn product of IVF pregnancy      Family History  Problem Relation Age of Onset   Thyroid disease Mother    Autoimmune disease Mother    Cancer Mother 33        kidney   Mental retardation Cousin    Autoimmune disease Cousin    Cancer Father 80       prostate   Autoimmune disease Maternal Aunt    Breast cancer Maternal Aunt 68   Cancer Maternal Grandmother 67       breast   Prostate cancer Maternal Grandfather 34   Melanoma Maternal Grandfather    Leukemia Maternal Grandfather    Drug abuse Paternal Grandmother    Drug abuse Paternal Grandfather    Alcohol abuse Paternal Grandfather      Social History   Socioeconomic History   Marital status: Married    Spouse name: Not on file   Number of children: Not on file   Years of education: Not on file   Highest education level: Not on file  Occupational History   Not on file  Tobacco Use   Smoking status: Never   Smokeless tobacco: Never  Substance and Sexual Activity   Alcohol use: Not Currently    Comment: occ   Drug use: No   Sexual activity: Yes  Other Topics Concern   Not on file  Social History Narrative   Not on file   Social Determinants of Health   Financial Resource Strain: Not on file  Food Insecurity: No Food Insecurity (08/15/2022)   Hunger Vital Sign    Worried About Running Out of Food in the Last Year: Never true  Ran Out of Food in the Last Year: Never true  Transportation Needs: No Transportation Needs (08/15/2022)   PRAPARE - Hydrologist (Medical): No    Lack of Transportation (Non-Medical): No  Physical Activity: Not on file  Stress: Not on file  Social Connections: Not on file  Intimate Partner Violence: Not At Risk (08/15/2022)   Humiliation, Afraid, Rape, and Kick questionnaire    Fear of Current or Ex-Partner: No    Emotionally Abused: No    Physically Abused: No    Sexually Abused: No     Allergies  Allergen Reactions   Eggs Or Egg-Derived Products Rash and Other (See Comments)    GI upset     Outpatient Medications Prior to Visit  Medication Sig Dispense Refill   apixaban (ELIQUIS) 5 MG TABS tablet  Advised to take Eliquis 10 mg twice daily for 7 days followed by 5 mg twice daily for 6 to 9 months. 60 tablet 6   lidocaine (LIDODERM) 5 % Place 1 patch onto the skin every 12 (twelve) hours as needed (pain). Remove & Discard patch within 12 hours or as directed by MD     naproxen (NAPROSYN) 500 MG tablet Take 500 mg by mouth 2 (two) times daily as needed for moderate pain.     tiZANidine (ZANAFLEX) 4 MG tablet Take 4 mg by mouth 2 (two) times daily as needed for muscle spasms.     No facility-administered medications prior to visit.   Review of Systems  Constitutional:  Negative for chills, fever, malaise/fatigue and weight loss.  HENT:  Negative for congestion, sinus pain and sore throat.   Eyes: Negative.   Respiratory:  Negative for cough, hemoptysis, sputum production, shortness of breath and wheezing.   Cardiovascular:  Negative for chest pain, palpitations, orthopnea, claudication and leg swelling.  Gastrointestinal:  Negative for abdominal pain, heartburn, nausea and vomiting.  Genitourinary: Negative.   Musculoskeletal:  Negative for joint pain and myalgias.  Skin:  Negative for rash.  Neurological:  Negative for weakness.  Endo/Heme/Allergies: Negative.   Psychiatric/Behavioral: Negative.     Objective:   Vitals:   12/04/22 1057  BP: 116/64  Pulse: 65  SpO2: 99%  Weight: 130 lb (59 kg)  Height: '5\' 2"'$  (1.575 m)   Physical Exam Constitutional:      General: She is not in acute distress.    Appearance: She is not ill-appearing.  HENT:     Head: Normocephalic and atraumatic.  Eyes:     General: No scleral icterus.    Conjunctiva/sclera: Conjunctivae normal.     Pupils: Pupils are equal, round, and reactive to light.  Cardiovascular:     Rate and Rhythm: Normal rate and regular rhythm.     Pulses: Normal pulses.     Heart sounds: Normal heart sounds. No murmur heard. Pulmonary:     Effort: Pulmonary effort is normal.     Breath sounds: Normal breath sounds. No  wheezing, rhonchi or rales.  Abdominal:     General: Bowel sounds are normal.     Palpations: Abdomen is soft.  Musculoskeletal:     Right lower leg: No edema.     Left lower leg: No edema.  Lymphadenopathy:     Cervical: No cervical adenopathy.  Skin:    General: Skin is warm and dry.  Neurological:     General: No focal deficit present.     Mental Status: She is alert.  Psychiatric:  Mood and Affect: Mood normal.        Behavior: Behavior normal.        Thought Content: Thought content normal.        Judgment: Judgment normal.    CBC    Component Value Date/Time   WBC 6.6 08/15/2022 0627   RBC 3.61 (L) 08/15/2022 0627   HGB 11.1 (L) 08/15/2022 0627   HCT 33.3 (L) 08/15/2022 0627   PLT 223 08/15/2022 0627   MCV 92.2 08/15/2022 0627   MCH 30.7 08/15/2022 0627   MCHC 33.3 08/15/2022 0627   RDW 12.2 08/15/2022 0627      Latest Ref Rng & Units 08/14/2022    8:17 PM 05/26/2022    6:13 PM  BMP  Glucose 70 - 99 mg/dL 84  146   BUN 6 - 20 mg/dL 12  12   Creatinine 0.44 - 1.00 mg/dL 0.82  0.91   Sodium 135 - 145 mmol/L 136  136   Potassium 3.5 - 5.1 mmol/L 4.3  4.2   Chloride 98 - 111 mmol/L 102  103   CO2 22 - 32 mmol/L 23  23   Calcium 8.9 - 10.3 mg/dL 9.9  9.8    Chest imaging: CT Chest 11/30/22 Cardiovascular: Pulsation artifact along the ascending aorta. No pericardial effusion. Normal caliber thoracic aorta. No pericardial effusion.   Mediastinum/Nodes: No specific abnormal lymph node enlargement identified in the axillary region, hilum or mediastinum. Normal caliber thoracic esophagus.   Lungs/Pleura: There is some linear opacity seen along the lung bases likely scar or atelectasis. No consolidation, pneumothorax or effusion. No discrete lung nodule.  CTA Chest 08/12/22 IMPRESSION: There are filling defects seen within the left upper and lower lobe segmental arteries as well as the distal left main pulmonary artery and right upper and lower segmental  arteries consistent with pulmonary emboli. No saddle embolism is seen. There is prominence of the right ventricle suggesting right heart strain.   There is a groundglass opacity within the left lung base favored to represent an evolving infarct or an inflammatory/infectious etiology. This will need interval follow-up within 2-3 months could less likely represent a developing nodule.   PFT:     No data to display          Labs:  Path:  Echo:  Heart Catheterization:  Assessment & Plan:   Multiple subsegmental pulmonary emboli without acute cor pulmonale (HCC)  Prothrombin gene mutation (HCC)  Discussion: Sabrina Reed is a 42 year old woman, never smoker who is referred to pulmonary clinic for pulmonary emboli.  She developed pulmonary emboli in setting of prothrombin gene mutation. She is to continue eliquis '5mg'$  twice daily until follow up with hematology and agree with plan to transition to prophylactic dosing at that time.   Her follow up CT Chest scan shows no filling defects and resolution of the ground glass opacities of the LLL. She has bibasilar atelectasis on follow up scan.   She is to continue follow up with Hematology regarding anticoagulation.   Follow up as needed.  Freda Jackson, MD [facility] Office: [phone]     Current Outpatient Medications:    apixaban (ELIQUIS) 5 MG TABS tablet, Advised to take Eliquis 10 mg twice daily for 7 days followed by 5 mg twice daily for 6 to 9 months., Disp: 60 tablet, Rfl: 6
